# Patient Record
Sex: Male | Born: 1985 | Race: Black or African American | Hispanic: No | Marital: Single | Smoking: Former smoker
Health system: Southern US, Community
[De-identification: ages and names within clinical notes are randomized; demographics above are authoritative.]

---

## 2011-06-19 ENCOUNTER — Emergency Department (HOSPITAL_COMMUNITY)
Admission: EM | Admit: 2011-06-19 | Discharge: 2011-06-20 | Disposition: A | Discharge status: Home / Self Care | Payer: Managed Care, Other (non HMO) | Attending: Emergency Medicine | Admitting: Emergency Medicine

## 2011-06-19 DIAGNOSIS — M79609 Pain in unspecified limb: Secondary | ICD-10-CM | POA: Insufficient documentation

## 2011-06-19 DIAGNOSIS — Z202 Contact with and (suspected) exposure to infections with a predominantly sexual mode of transmission: Secondary | ICD-10-CM | POA: Insufficient documentation

## 2011-06-19 DIAGNOSIS — R369 Urethral discharge, unspecified: Secondary | ICD-10-CM | POA: Insufficient documentation

## 2011-06-23 LAB — GC/CHLAMYDIA PROBE AMP, GENITAL: Chlamydia, DNA Probe: NEGATIVE

## 2011-06-25 ENCOUNTER — Inpatient Hospital Stay (INDEPENDENT_AMBULATORY_CARE_PROVIDER_SITE_OTHER)
Admission: RE | Admit: 2011-06-25 | Discharge: 2011-06-25 | Disposition: A | Discharge status: Home / Self Care | Payer: Managed Care, Other (non HMO) | Source: Ambulatory Visit | Attending: Emergency Medicine | Admitting: Emergency Medicine

## 2011-06-25 ENCOUNTER — Ambulatory Visit (INDEPENDENT_AMBULATORY_CARE_PROVIDER_SITE_OTHER): Payer: Managed Care, Other (non HMO)

## 2011-06-25 DIAGNOSIS — M659 Synovitis and tenosynovitis, unspecified: Secondary | ICD-10-CM

## 2012-09-27 ENCOUNTER — Encounter (HOSPITAL_COMMUNITY): Payer: Self-pay | Admitting: Emergency Medicine

## 2012-09-27 ENCOUNTER — Emergency Department (HOSPITAL_COMMUNITY)
Admission: EM | Admit: 2012-09-27 | Discharge: 2012-09-27 | Disposition: A | Discharge status: Home / Self Care | Payer: BC Managed Care – PPO | Attending: Emergency Medicine | Admitting: Emergency Medicine

## 2012-09-27 DIAGNOSIS — L03019 Cellulitis of unspecified finger: Secondary | ICD-10-CM | POA: Insufficient documentation

## 2012-09-27 DIAGNOSIS — Z87891 Personal history of nicotine dependence: Secondary | ICD-10-CM | POA: Insufficient documentation

## 2012-09-27 DIAGNOSIS — L03012 Cellulitis of left finger: Secondary | ICD-10-CM

## 2012-09-27 MED ORDER — CEPHALEXIN 500 MG PO CAPS: 500.0000 mg | ORAL_CAPSULE | Freq: Four times a day (QID) | ORAL | Status: DC

## 2012-09-27 MED ORDER — NAPROXEN 500 MG PO TABS: 500.0000 mg | ORAL_TABLET | Freq: Two times a day (BID) | ORAL | Status: DC

## 2012-09-27 NOTE — ED Notes (Signed)
Pt presents w/ infection to the bed of his left thumb nail, states hx of same. Noticed it getting sore last week.

## 2012-09-27 NOTE — ED Provider Notes (Signed)
Medical screening examination/treatment/procedure(s) were performed by non-physician practitioner and as supervising physician I was immediately available for consultation/collaboration.    Gaetan Spieker R Salvador Bigbee, MD 09/27/12 2234 

## 2012-09-27 NOTE — ED Provider Notes (Signed)
History     CSN: [id number]  Arrival date & time 09/27/12  1508   First MD Initiated Contact with Patient 09/27/12 1539      Chief Complaint  Patient presents with  . Nail Problem    (Consider location/radiation/quality/duration/timing/severity/associated sxs/prior treatment) HPI Louis Freeman is a 26 y.o. male who presents to ed with complaint of left thumb infection. 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 States now swelling, pain, tenderness around the nail. States soaked it in epsom salt, it is worsening. No fever, chills, no pain going down the finger. No pain with movement of the finger.    History reviewed. No pertinent past medical history.  History reviewed. No pertinent past surgical history.  No family history on file.  History  Substance Use Topics  . Smoking status: Former Smoker    Quit date: 05/27/2012  . Smokeless tobacco: Never Used  . Alcohol Use: No      Review of Systems  Constitutional: Negative for fever and chills.  Musculoskeletal:       Finger pain  Skin: Positive for wound.  Neurological: Negative for weakness and numbness.    Allergies  Review of patient's allergies indicates no known allergies.  Home Medications  No current outpatient prescriptions on file.  BP 117/66  Pulse 63  Temp 98.4 F (36.9 C) (Oral)  Resp 16  SpO2 99%  Physical Exam  Nursing note and vitals reviewed. Constitutional: He is oriented to person, place, and time. He appears well-developed and well-nourished. No distress.  Eyes: Conjunctivae normal are normal.  Cardiovascular: Normal rate, regular rhythm and normal heart sounds.   Pulmonary/Chest: Effort normal and breath sounds normal. No respiratory distress. He has no wheezes. He has no rales.  Musculoskeletal:       paronychia around left thumb nail. Tender to palpation. Normal joints of the thumb, full ROM of the finger.   Neurological: He is alert and oriented  to person, place, and time.    ED Course  Procedures (including critical care time)  INCISION AND DRAINAGE Performed by: Jaynie Crumble A Consent: Verbal consent obtained. Risks and benefits: risks, benefits and alternatives were discussed Type: paronychia  Body area: left thumb Anesthesia:none   Incision was made with a scalpel.  Drainage: purulent, bloody  Drainage amount: small   Patient tolerance: Patient tolerated the procedure well with no immediate complications.     1. Paronychia of thumb, left       MDM  Left thumb paronychia, drained in ED. Pt afebrile, non toxic. Will start on keflex for infection, soapy warm soaks at home, follow up if worsening.         Lottie Mussel, [place] 09/27/12 2104

## 2012-10-02 ENCOUNTER — Encounter (HOSPITAL_COMMUNITY): Payer: Self-pay | Admitting: *Deleted

## 2012-10-02 ENCOUNTER — Emergency Department (HOSPITAL_COMMUNITY)
Admission: EM | Admit: 2012-10-02 | Discharge: 2012-10-02 | Disposition: A | Discharge status: Home / Self Care | Payer: BC Managed Care – PPO | Attending: Emergency Medicine | Admitting: Emergency Medicine

## 2012-10-02 DIAGNOSIS — IMO0002 Reserved for concepts with insufficient information to code with codable children: Secondary | ICD-10-CM

## 2012-10-02 DIAGNOSIS — L03019 Cellulitis of unspecified finger: Secondary | ICD-10-CM | POA: Insufficient documentation

## 2012-10-02 DIAGNOSIS — Z87891 Personal history of nicotine dependence: Secondary | ICD-10-CM | POA: Insufficient documentation

## 2012-10-02 MED ORDER — HYDROCODONE-ACETAMINOPHEN 5-325 MG PO TABS: 1.0000 | ORAL_TABLET | ORAL | Status: DC | PRN

## 2012-10-02 MED ORDER — IBUPROFEN 800 MG PO TABS: 800.0000 mg | ORAL_TABLET | Freq: Three times a day (TID) | ORAL | Status: DC | PRN

## 2012-10-02 MED ORDER — SULFAMETHOXAZOLE-TRIMETHOPRIM 800-160 MG PO TABS: 2.0000 | ORAL_TABLET | Freq: Two times a day (BID) | ORAL | Status: AC

## 2012-10-02 NOTE — ED Provider Notes (Signed)
Medical screening examination/treatment/procedure(s) were conducted as a shared visit with non-physician practitioner(s) and myself.  I personally evaluated the patient during the encounter  Patient a large paronychia of his thumb.  This was drained at the bedside.  I was present for the procedure.  He does not have frank evidence of a felon at this time.  I think switching the patient to Bactrim and continuing warm soap soaks as a beneficial thing.  Recommend returning emergency permanent 48 hours for recheck of his thumb.  No systemic symptoms.  Well-appearing.  Lyanne Co, MD 10/02/12 1229

## 2012-10-02 NOTE — ED Notes (Signed)
Pt seen here last week with left thumb infection, continues to swell and painful

## 2012-10-02 NOTE — ED Provider Notes (Signed)
History     CSN: [id number]  Arrival date & time 10/02/12  4098   First MD Initiated Contact with Patient 10/02/12 0735      Chief Complaint  Patient presents with  . Hand Problem    left thumb infection    (Consider location/radiation/quality/duration/timing/severity/associated sxs/prior treatment) HPI Comments: Pt seen in ED 5 days ago for left thumb infection, diagnosed with paronychia and placed on keflex, presents today for worsening symptoms.  Reports increased swelling and pain in his thumb, mild decreased sensation in the swollen area.  Pain is throbbing, 8/10 intensity.  Pt is unable to bend interphalangeal joint due to swelling but has full ROM of MCP without pain or difficulty.  Has been soaking it in warm soapy water, water with epsom salt, and peroxide without improvement.  Denies fevers, pain or redness going beyond his thumb.  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  Pt is left handed.   The history is provided by the patient and medical records.    History reviewed. No pertinent past medical history.  History reviewed. No pertinent past surgical history.  No family history on file.  History  Substance Use Topics  . Smoking status: Former Smoker    Quit date: 05/27/2012  . Smokeless tobacco: Never Used  . Alcohol Use: No      Review of Systems  Constitutional: Negative for fever and chills.  Skin: Positive for wound. Negative for pallor.  Neurological: Positive for numbness. Negative for weakness.    Allergies  Review of patient's allergies indicates no known allergies.  Home Medications   Current Outpatient Rx  Name  Route  Sig  Dispense  Refill  . CEPHALEXIN 500 MG PO CAPS   Oral   Take 1 capsule (500 mg total) by mouth 4 (four) times daily.   28 capsule   0   . NAPROXEN 500 MG PO TABS   Oral   Take 1 tablet (500 mg total) by mouth 2 (two) times daily.   30 tablet   0     BP 119/74   Pulse 55  Temp 97.8 F (36.6 C) (Oral)  Resp 18  SpO2 99%  Physical Exam  Nursing note and vitals reviewed. Constitutional: He appears well-developed and well-nourished. No distress.  HENT:  Head: Normocephalic and atraumatic.  Neck: Neck supple.  Pulmonary/Chest: Effort normal.  Musculoskeletal:       Left hand: He exhibits decreased range of motion, tenderness and swelling. He exhibits normal capillary refill and no deformity. decreased sensation noted. Normal strength noted.       Hands: Neurological: He is alert.  Skin: He is not diaphoretic.    ED Course  Procedures (including critical care time)  Labs Reviewed - No data to display No results found.  Discussed patient with Dr Patria Mane who has also seen the patient.   INCISION AND DRAINAGE Performed by: Trixie Dredge B Consent: Verbal consent obtained. Risks and benefits: risks, benefits and alternatives were discussed Type: paronychia  Body area:left thumb  Anesthesia: digital block  Incision was made with a scalpel.  Local anesthetic: lidocaine 2% no epinephrine  Anesthetic total: 4.5 ml  Drainage: purulent  Drainage amount: large  Packing material: none  Patient tolerance: Patient tolerated the procedure well with no immediate complications.  Incision and drainage performed with Dr Patria Mane.     1. Paronychia     MDM  Patient with paronychia, seen  in ED 5 days ago with I&D with minimal drainage placed on keflex outpatient, returns today for worsening symptoms.  I&D performed today with moderate to large amount of purulent drainage.  Pt placed on bactrim DS with 48 hr follow up in ED.  Discussed diagnosis, treatment plan, and follow up with patient.  Discussed return precautions in depth.  Pt verbalizes understanding and agrees with plan.           [place], [place] 10/02/12 1025

## 2013-04-25 IMAGING — CR DG FOOT COMPLETE 3+V*R*
3 series · 3 of 3 positions shown · non-contrast
Comparison: None.

CLINICAL DATA: Pain overlying the fourth/fifth proximal
metatarsals.

RIGHT FOOT COMPLETE - 3+ VIEW

[view not recorded (1 of 3)]
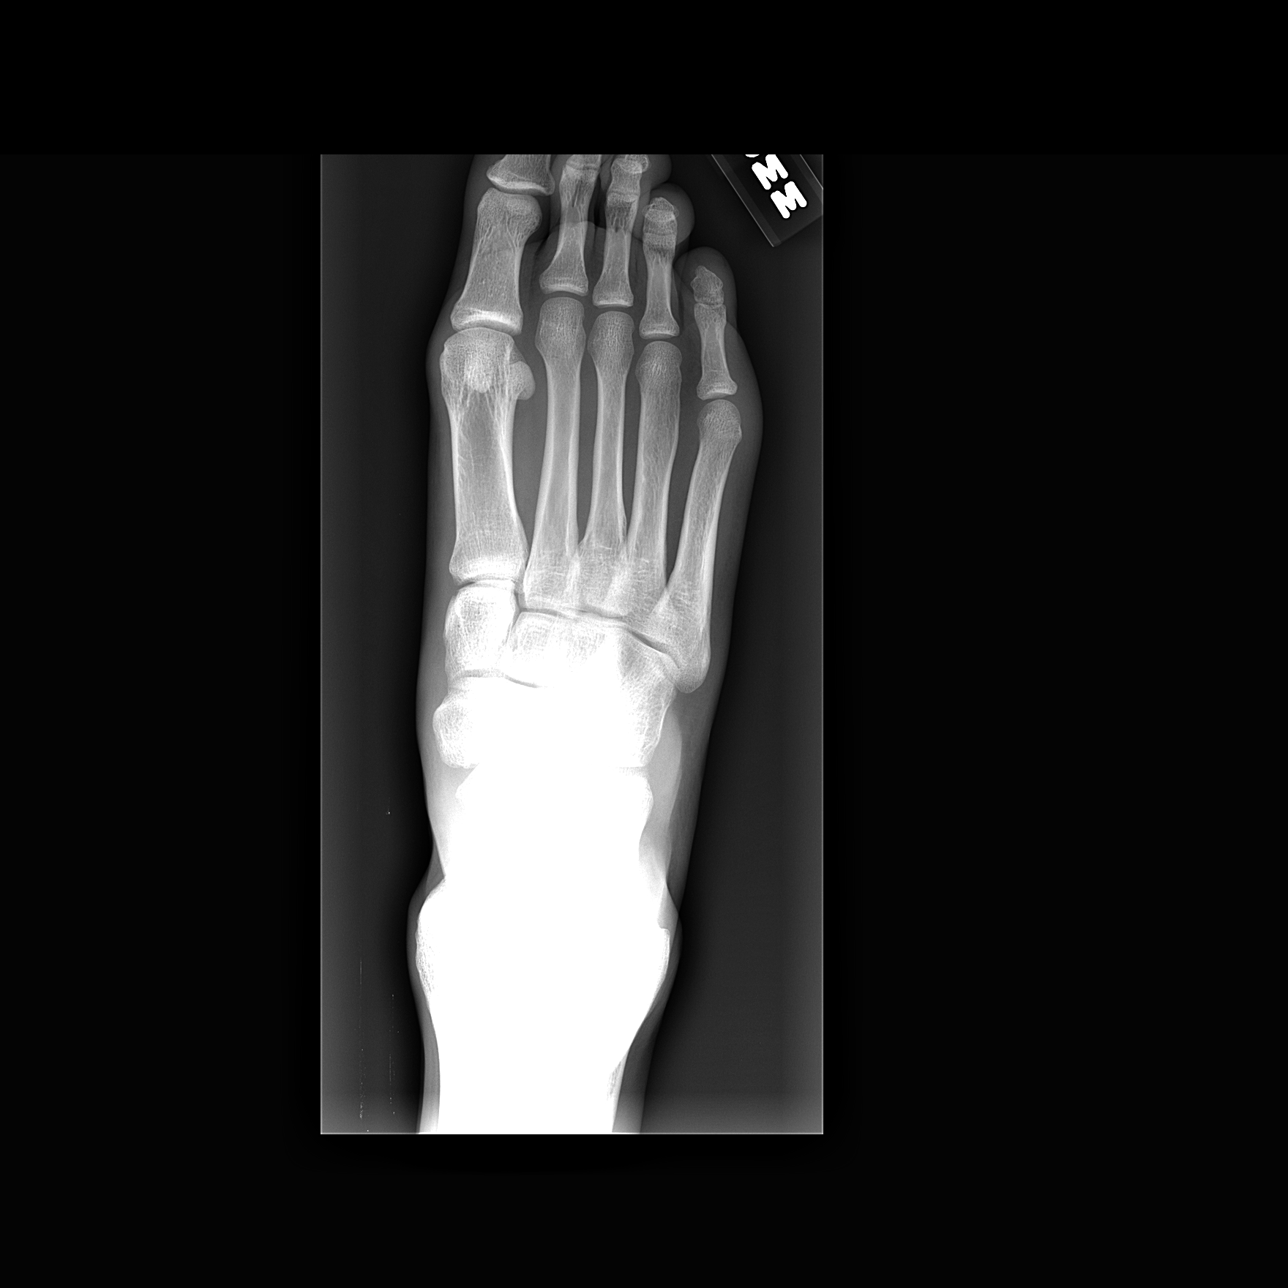

[view not recorded (2 of 3)]
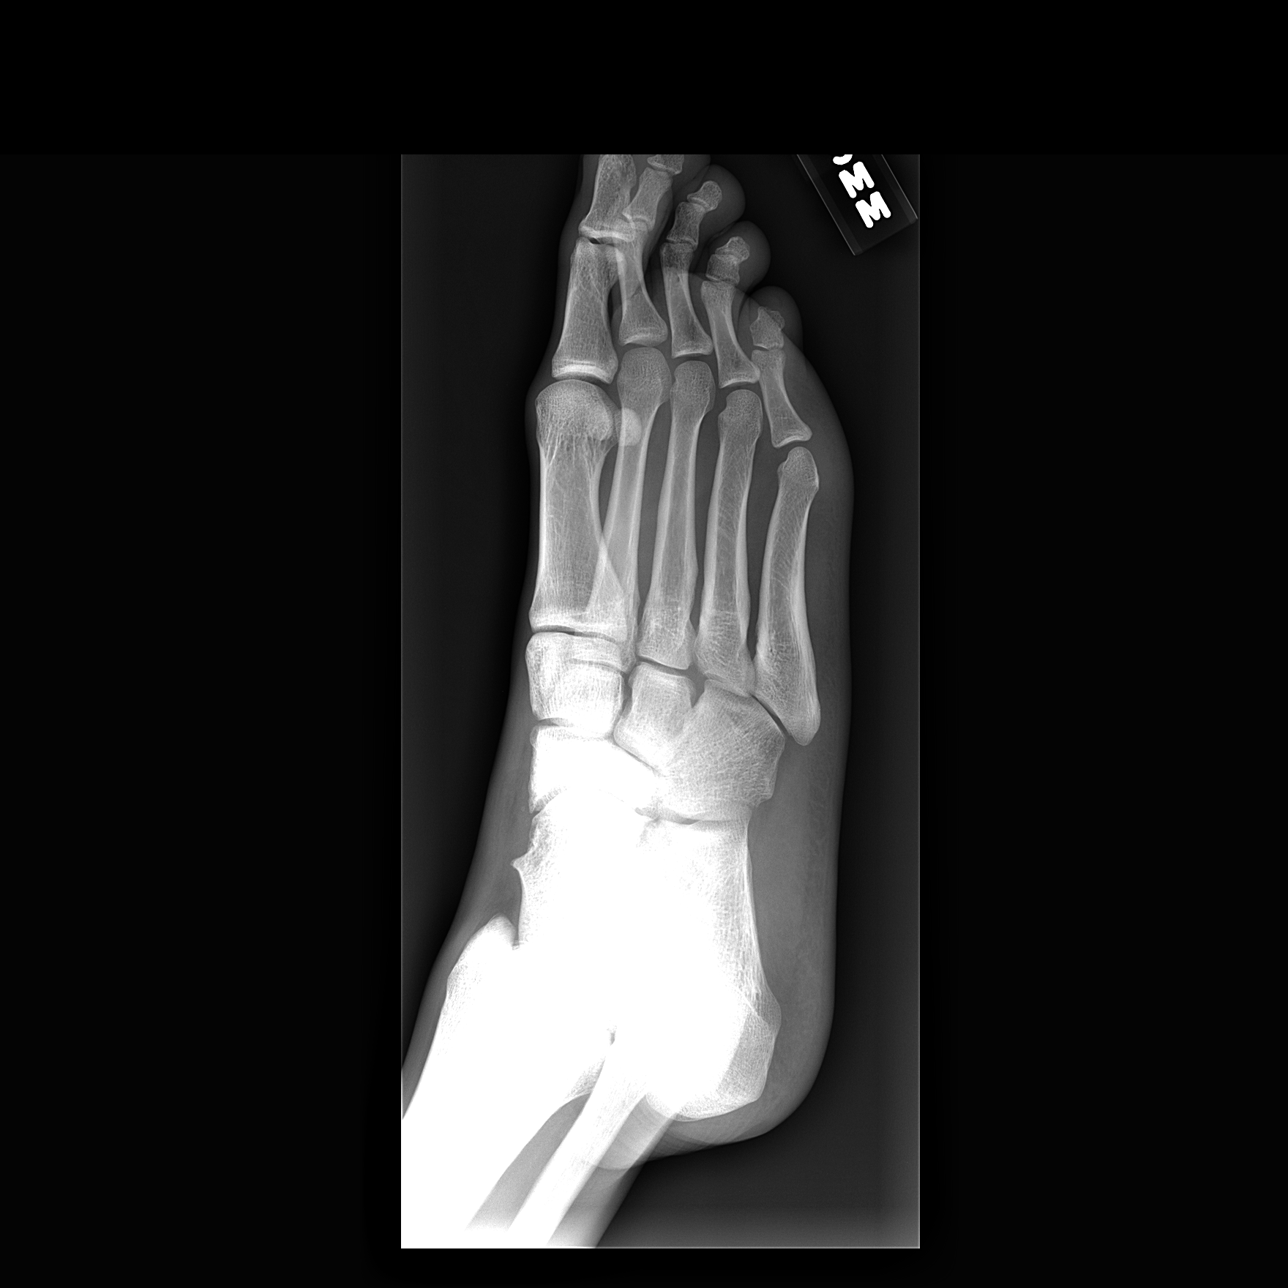

[view not recorded (3 of 3)]
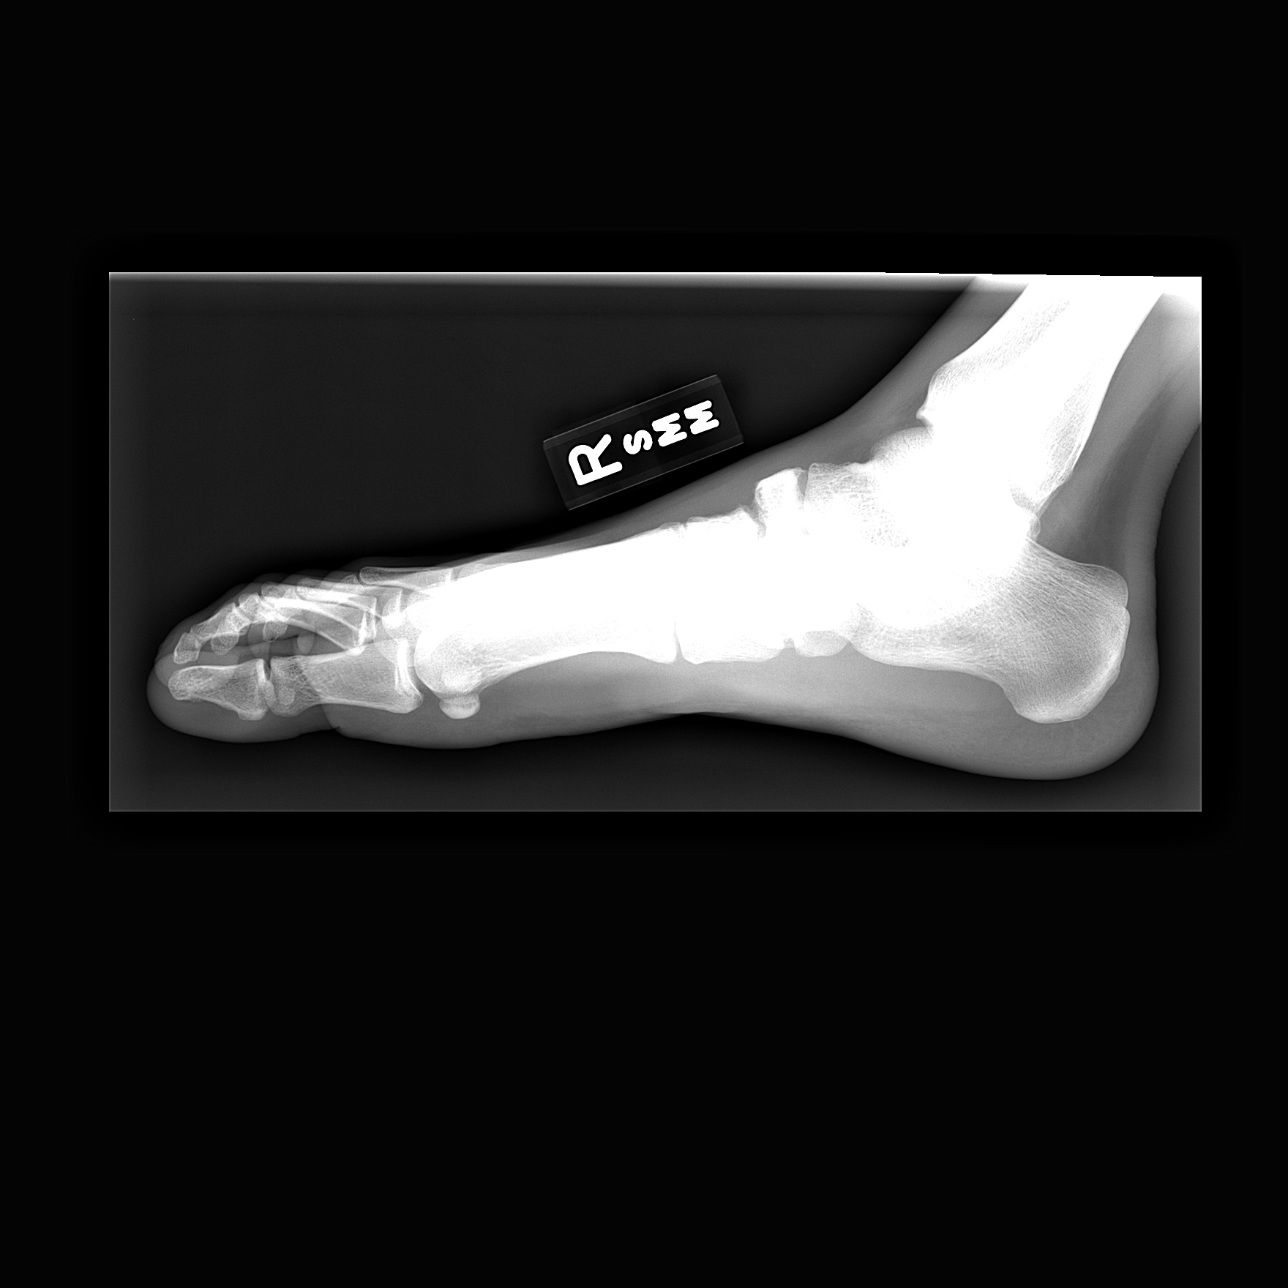

[3 of 3 positions shown; findings below may reference images not displayed]

FINDINGS: No fracture or dislocation is seen.

The joint spaces are preserved.

The visualized soft tissues are unremarkable.
IMPRESSION: No fracture or dislocation is seen.

## 2014-07-16 ENCOUNTER — Other Ambulatory Visit (HOSPITAL_COMMUNITY)
Admission: RE | Admit: 2014-07-16 | Discharge: 2014-07-16 | Disposition: A | Discharge status: Home / Self Care | Payer: Self-pay | Source: Ambulatory Visit | Attending: Emergency Medicine | Admitting: Emergency Medicine

## 2014-07-16 ENCOUNTER — Encounter (HOSPITAL_COMMUNITY): Payer: Self-pay | Admitting: Emergency Medicine

## 2014-07-16 ENCOUNTER — Emergency Department (INDEPENDENT_AMBULATORY_CARE_PROVIDER_SITE_OTHER)
Admission: EM | Admit: 2014-07-16 | Discharge: 2014-07-16 | Disposition: A | Discharge status: Home / Self Care | Payer: Self-pay | Source: Home / Self Care | Attending: Emergency Medicine | Admitting: Emergency Medicine

## 2014-07-16 DIAGNOSIS — Z113 Encounter for screening for infections with a predominantly sexual mode of transmission: Secondary | ICD-10-CM | POA: Insufficient documentation

## 2014-07-16 DIAGNOSIS — N342 Other urethritis: Secondary | ICD-10-CM

## 2014-07-16 MED ORDER — AZITHROMYCIN 250 MG PO TABS
ORAL_TABLET | ORAL | Status: AC
  Filled 2014-07-16: qty 4

## 2014-07-16 MED ORDER — CEFTRIAXONE SODIUM 250 MG IJ SOLR
250.0000 mg | Freq: Once | INTRAMUSCULAR | Status: AC
  Administered 2014-07-16: 250 mg via INTRAMUSCULAR

## 2014-07-16 MED ORDER — CEFTRIAXONE SODIUM 250 MG IJ SOLR
INTRAMUSCULAR | Status: AC
  Filled 2014-07-16: qty 250

## 2014-07-16 MED ORDER — AZITHROMYCIN 250 MG PO TABS
1000.0000 mg | ORAL_TABLET | Freq: Every day | ORAL | Status: DC
  Administered 2014-07-16: 1000 mg via ORAL

## 2014-07-16 MED ORDER — LIDOCAINE HCL (PF) 1 % IJ SOLN
INTRAMUSCULAR | Status: AC
  Filled 2014-07-16: qty 5

## 2014-07-16 NOTE — Discharge Instructions (Signed)
Urethritis °Urethritis is an inflammation of the tube through which urine exits your bladder (urethra).  °CAUSES °Urethritis is often caused by an infection in your urethra. The infection can be viral, like herpes. The infection can also be bacterial, like gonorrhea. °RISK FACTORS °Risk factors of urethritis include: °· Having sex without using a condom. °· Having multiple sexual partners. °· Having poor hygiene. °SIGNS AND SYMPTOMS °Symptoms of urethritis are less noticeable in women than in men. These symptoms include: °· Burning feeling when you urinate (dysuria). °· Discharge from your urethra. °· Blood in your urine (hematuria). °· Urinating more than usual. °DIAGNOSIS  °To confirm a diagnosis of urethritis, your health care provider will do the following: °· Ask about your sexual history. °· Perform a physical exam. °· Have you provide a sample of your urine for lab testing. °· Use a cotton swab to gently collect a sample from your urethra for lab testing. °TREATMENT  °It is important to treat urethritis. Depending on the cause, untreated urethritis may lead to serious genital infections and possibly infertility. Urethritis caused by a bacterial infection is treated with antibiotic medicine. All sexual partners must be treated.  °HOME CARE INSTRUCTIONS °· Do not have sex until the test results are known and treatment is completed, even if your symptoms go away before you finish treatment. °· If you were prescribed an antibiotic, finish it all even if you start to feel better. °SEEK MEDICAL CARE IF:  °· Your symptoms are not improved in 3 days. °· Your symptoms are getting worse. °· You develop abdominal pain or pelvic pain (in women). °· You develop joint pain. °· You have a fever. °SEEK IMMEDIATE MEDICAL CARE IF:  °· You have severe pain in the belly, back, or side. °· You have repeated vomiting. °MAKE SURE YOU: °· Understand these instructions. °· Will watch your condition. °· Will get help right away if you  are not doing well or get worse. °Document Released: 04/16/2001 Document Revised: 03/07/2014 Document Reviewed: 06/21/2013 °Exit[employer]® Patient Information ©2015 [employer]. This information is not intended to replace advice given to you by your health care provider. Make sure you discuss any questions you have with your health care provider. ° °

## 2014-07-16 NOTE — ED Provider Notes (Signed)
CSN: [id number]     Arrival date & time 07/16/14  1833 History   First MD Initiated Contact with Patient 07/16/14 1850     Chief Complaint  Patient presents with  . Exposure to STD   (Consider location/radiation/quality/duration/timing/severity/associated sxs/prior Treatment) HPI Comments: 28 year old male presents for evaluation of penile discharge. This started yesterday. He has penile discharge without any other symptoms. He had gonorrhea 3 years ago and he is worried that he was not adequately treated although he did get fully treated while he was in the emergency department. He denies any new sexual partners. No testicle pain or abdominal pain. No burning with urination.  Patient is a 28 y.o. male presenting with STD exposure.  Exposure to STD    History reviewed. No pertinent past medical history. History reviewed. No pertinent past surgical history. History reviewed. No pertinent family history. History  Substance Use Topics  . Smoking status: Former Smoker    Quit date: 05/27/2012  . Smokeless tobacco: Never Used  . Alcohol Use: No    Review of Systems  Genitourinary: Positive for discharge.  All other systems reviewed and are negative.   Allergies  Review of patient's allergies indicates no known allergies.  Home Medications   Prior to Admission medications   Medication Sig Start Date End Date Taking? Authorizing Provider  acetaminophen (TYLENOL) 500 MG tablet Take 500 mg by mouth every 6 (six) hours as needed. pain    Historical Provider, MD  cephALEXin (KEFLEX) 500 MG capsule Take 1 capsule (500 mg total) by mouth 4 (four) times daily. 09/27/12   Tatyana A Kirichenko, PA-C  HYDROcodone-acetaminophen (NORCO/VICODIN) 5-325 MG per tablet Take 1 tablet by mouth every 4 (four) hours as needed for pain. 10/02/12   Trixie Dredge, PA-C  ibuprofen (ADVIL,MOTRIN) 800 MG tablet Take 1 tablet (800 mg total) by mouth every 8 (eight) hours as needed for pain. 10/02/12   Trixie Dredge,  PA-C  naproxen (NAPROSYN) 500 MG tablet Take 1 tablet (500 mg total) by mouth 2 (two) times daily. 09/27/12   Tatyana A Kirichenko, PA-C   BP 124/78  Pulse 60  Temp(Src) 98 F (36.7 C) (Oral)  Resp 16  SpO2 100% Physical Exam  Nursing note and vitals reviewed. Constitutional: He is oriented to person, place, and time. He appears well-developed and well-nourished. No distress.  HENT:  Head: Normocephalic.  Pulmonary/Chest: Effort normal. No respiratory distress.  Genitourinary: Discharge (mucoid) found.  Lymphadenopathy:       Right: Inguinal adenopathy present.       Left: Inguinal adenopathy present.  Neurological: He is alert and oriented to person, place, and time. Coordination normal.  Skin: Skin is warm and dry. No rash noted. He is not diaphoretic.  Psychiatric: He has a normal mood and affect. Judgment normal.    ED Course  Procedures (including critical care time) Labs Review Labs Reviewed  URINE CULTURE  URINE CYTOLOGY ANCILLARY ONLY    Imaging Review No results found.   MDM   1. Urethritis    Treated for GC and Chlamydia. Urine cytology sent. Followup PRN.  If positive, had test for cure in 3-6 weeks.   Meds ordered this encounter  Medications  . cefTRIAXone (ROCEPHIN) injection 250 mg    Sig:   . azithromycin (ZITHROMAX) tablet 1,000 mg    Sig:        Graylon Good, PA-C 07/16/14 1919

## 2014-07-16 NOTE — ED Notes (Addendum)
C/o penile discharge onset today.  He was treated for GC 1 1/2 yrs ago, but it came back.  I told pt. It does not come back unless he was re-exposed.  No dysuria. Chart reviewed and pt. was adequately treated for GC with Rocephin and Zithromax on 06/19/2011.

## 2014-07-18 LAB — URINE CULTURE
COLONY COUNT: NO GROWTH
CULTURE: NO GROWTH

## 2014-07-18 NOTE — ED Provider Notes (Signed)
Medical screening examination/treatment/procedure(s) were performed by resident physician or non-physician practitioner and as supervising physician I was immediately available for consultation/collaboration.   Barkley Bruns MD.   Linna Hoff, MD 07/18/14 [phone]

## 2015-03-16 ENCOUNTER — Ambulatory Visit (INDEPENDENT_AMBULATORY_CARE_PROVIDER_SITE_OTHER): Payer: 59 | Admitting: Family Medicine

## 2015-03-16 VITALS — BP 114/58 | HR 50 | Temp 97.9°F | Resp 16 | Ht 71.0 in | Wt 155.0 lb

## 2015-03-16 DIAGNOSIS — G44219 Episodic tension-type headache, not intractable: Secondary | ICD-10-CM

## 2015-03-16 MED ORDER — CYCLOBENZAPRINE HCL 10 MG PO TABS: ORAL_TABLET | ORAL | Status: DC

## 2015-03-16 NOTE — Progress Notes (Signed)
   Subjective:    Patient ID: Louis Freeman, male    DOB: [birth year], 29 y.o.   MRN: [id number]  HPI Patient presents today with headache for 5 days. Pain is "all over," and occasionally in his occipital area. It is worse with bending over. No recent or past head injury. Tried one dose of tylenol without relief. Had a similar headache in the past that did not last as long. Pain currently a 7/10. Was worse this morning when he woke up, but has eased off. He otherwise feels well. He slept well last night. [place] recently flew to [place] for his job with [employer]. He thinks the flight might have worsened his pain. He lifts heavy ladders at work, but denies any back or neck pain.  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 He has had several documented heart rates in the 50-60s. He denies dizziness, chest pain, SOB.    History reviewed. No pertinent past medical history. History reviewed. No pertinent past surgical history. History reviewed. No pertinent family history. History  Substance Use Topics  . Smoking status: Former Smoker    Quit date: 05/27/2012  . Smokeless tobacco: Never Used  . Alcohol Use: 1.2 oz/week    2 Standard drinks or equivalent per week   Medications, allergies, past medical history, surgical history, family history, social history and problem list reviewed and updated.  Review of Systems No fever, no nasal drainage/congestion/facial pain, no nausea/abdominal pain. No visual changes. No aura. No photo/phonosensitivity.     Objective:   Physical Exam  Constitutional: He is oriented to person, place, and time. He appears well-developed and well-nourished.  HENT:  Head: Normocephalic and atraumatic.  Eyes: Conjunctivae are normal. Pupils are equal, round, and reactive to light.  Neck: Normal range of motion. Neck supple.  Cardiovascular: Regular rhythm and normal heart sounds.  Bradycardia present.   HR by auscultation 54.  Pulmonary/Chest:  Effort normal and breath sounds normal.  Musculoskeletal: Normal range of motion. He exhibits no edema or tenderness.  Neurological: He is alert and oriented to person, place, and time. No cranial nerve deficit. Coordination normal.  Skin: Skin is warm and dry.  Psychiatric: He has a normal mood and affect. His behavior is normal. Judgment and thought content normal.  Vitals reviewed.  BP 114/58 mmHg  Pulse 50  Temp(Src) 97.9 F (36.6 C) (Oral)  Resp 16  Ht 5\' 11"  (1.803 m)  Wt 155 lb (70.308 kg)  BMI 21.63 kg/m2  SpO2 99%     Assessment & Plan:  1. Episodic tension-type headache, not intractable -Provided written and verbal information regarding diagnosis and treatment. - Discussed warning signs that require immediate follow up- severe pain, visual changes, vomiting. - Provided patient with some Alleve samples- can take 2 twice a day and suggested he add a serving of caffeine. - cyclobenzaprine (FLEXERIL) 10 MG tablet; Take 1/2 to 1 tablet at bedtime as needed  Dispense: 10 tablet; Refill: 0 - RTC if no improvement in 24 hours  Olean Reeeborah Patrisha Hausmann, FNP-BC  [facility]  03/16/2015 10:43 PM

## 2015-03-16 NOTE — Patient Instructions (Signed)

## 2015-09-24 ENCOUNTER — Ambulatory Visit (INDEPENDENT_AMBULATORY_CARE_PROVIDER_SITE_OTHER): Payer: 59 | Admitting: Physician Assistant

## 2015-09-24 VITALS — BP 108/70 | HR 85 | Temp 98.1°F | Resp 18 | Ht 71.5 in | Wt 151.4 lb

## 2015-09-24 DIAGNOSIS — R1084 Generalized abdominal pain: Secondary | ICD-10-CM | POA: Diagnosis not present

## 2015-09-24 MED ORDER — DICYCLOMINE HCL 10 MG PO CAPS: 10.0000 mg | ORAL_CAPSULE | Freq: Three times a day (TID) | ORAL | Status: DC

## 2015-09-24 NOTE — Patient Instructions (Signed)
Push fluids Eat as tolerated

## 2015-09-24 NOTE — Progress Notes (Signed)
   Louis Freeman  MRN: [id number] DOB: Feb 12, 1986  Subjective:  Pt presents to clinic with 4 day/ h/o sharp abd pain.  Location of the pain moves around.  When the pain started he had loose stool but now he is straining to past a stool since yesterday after he took a dose of imodium.  He is having no F/C.   Did have a small amount of blood on toilet tissue today but none on the stool that he noticed.  No one is sick at home.  He over all feels better the pain is less frequent but not less intense.  He does not have the pain at work it only happens at home and he is not having trouble sleeping at night.  Sick contacts.  OTC meds - Imodium yesterday  There are no active problems to display for this patient.   No current outpatient prescriptions on file prior to visit.   No current facility-administered medications on file prior to visit.    No Known Allergies  Review of Systems  Constitutional: Positive for chills. Negative for fever.  HENT: Negative.   Gastrointestinal: Positive for abdominal pain and blood in stool (with wiping). Negative for nausea, vomiting and diarrhea.  Genitourinary: Negative.    Objective:  BP 108/70 mmHg  Pulse 85  Temp(Src) 98.1 F (36.7 C) (Oral)  Resp 18  Ht 5' 11.5" (1.816 m)  Wt 151 lb 6.4 oz (68.675 kg)  BMI 20.82 kg/m2  SpO2 98%  Physical Exam  Constitutional: He is oriented to person, place, and time and well-developed, well-nourished, and in no distress.  HENT:  Head: Normocephalic and atraumatic.  Right Ear: External ear normal.  Left Ear: External ear normal.  Eyes: Conjunctivae are normal.  Neck: Normal range of motion.  Cardiovascular: Normal rate, regular rhythm and normal heart sounds.   Pulmonary/Chest: Effort normal and breath sounds normal. He has no wheezes.  Abdominal: Soft. Bowel sounds are normal. There is no tenderness.  Neurological: He is alert and oriented to person, place, and time. Gait normal.  Skin: Skin is warm  and dry.  Psychiatric: Mood, memory, affect and judgment normal.    Assessment and Plan :  Generalized abdominal pain - Plan: dicyclomine (BENTYL) 10 MG capsule  Symptomatic treatment d/w pt.  I am reassured because the pain is intermittent and less frequent and the position of the pain changes.  We will symptomatically treat his pain with bentyl and he will monitor for continued and worsening bleeding and RTC if that occurs.  We talked about constipation and hemorrhoids resulting that could cause the bleeding that he experienced today.  His questions were answered and he agrees and understands the plan.  Benny LennertSarah Jolana Runkles PA-C  [facility] 09/24/2015 4:35 PM

## 2015-09-27 ENCOUNTER — Emergency Department (HOSPITAL_COMMUNITY)
Admission: EM | Admit: 2015-09-27 | Discharge: 2015-09-27 | Disposition: A | Discharge status: Home / Self Care | Payer: 59 | Attending: Emergency Medicine | Admitting: Emergency Medicine

## 2015-09-27 ENCOUNTER — Encounter (HOSPITAL_COMMUNITY): Payer: Self-pay

## 2015-09-27 DIAGNOSIS — R1030 Lower abdominal pain, unspecified: Secondary | ICD-10-CM | POA: Diagnosis present

## 2015-09-27 DIAGNOSIS — Z79899 Other long term (current) drug therapy: Secondary | ICD-10-CM | POA: Diagnosis not present

## 2015-09-27 DIAGNOSIS — Z87891 Personal history of nicotine dependence: Secondary | ICD-10-CM | POA: Insufficient documentation

## 2015-09-27 LAB — URINALYSIS, ROUTINE W REFLEX MICROSCOPIC
Bilirubin Urine: NEGATIVE
GLUCOSE, UA: NEGATIVE mg/dL
HGB URINE DIPSTICK: NEGATIVE
Ketones, ur: NEGATIVE mg/dL
Leukocytes, UA: NEGATIVE
Nitrite: NEGATIVE
PH: 7 (ref 5.0–8.0)
PROTEIN: NEGATIVE mg/dL
Specific Gravity, Urine: 1.019 (ref 1.005–1.030)

## 2015-09-27 LAB — COMPREHENSIVE METABOLIC PANEL
ALBUMIN: 4.5 g/dL (ref 3.5–5.0)
ALK PHOS: 40 U/L (ref 38–126)
ALT: 12 U/L — AB (ref 17–63)
AST: 19 U/L (ref 15–41)
Anion gap: 6 (ref 5–15)
BUN: 11 mg/dL (ref 6–20)
CALCIUM: 10.1 mg/dL (ref 8.9–10.3)
CHLORIDE: 103 mmol/L (ref 101–111)
CO2: 29 mmol/L (ref 22–32)
CREATININE: 1.27 mg/dL — AB (ref 0.61–1.24)
GFR calc Af Amer: >60 mL/min (ref >60)
GFR calc non Af Amer: >60 mL/min (ref >60)
GLUCOSE: 95 mg/dL (ref 65–99)
Potassium: 4.7 mmol/L (ref 3.5–5.1)
SODIUM: 138 mmol/L (ref 135–145)
Total Bilirubin: 0.8 mg/dL (ref 0.3–1.2)
Total Protein: 7.2 g/dL (ref 6.5–8.1)

## 2015-09-27 LAB — CBC WITH DIFFERENTIAL/PLATELET
BASOS ABS: 0 10*3/uL (ref 0.0–0.1)
Basophils Relative: 0 %
EOS ABS: 0.1 10*3/uL (ref 0.0–0.7)
EOS PCT: 2 %
HCT: 45.2 % (ref 39.0–52.0)
HEMOGLOBIN: 15.7 g/dL (ref 13.0–17.0)
LYMPHS ABS: 1.7 10*3/uL (ref 0.7–4.0)
Lymphocytes Relative: 37 %
MCH: 30.8 pg (ref 26.0–34.0)
MCHC: 34.7 g/dL (ref 30.0–36.0)
MCV: 88.6 fL (ref 78.0–100.0)
Monocytes Absolute: 0.6 10*3/uL (ref 0.1–1.0)
Monocytes Relative: 12 %
NEUTROS PCT: 49 %
Neutro Abs: 2.2 10*3/uL (ref 1.7–7.7)
PLATELETS: 270 10*3/uL (ref 150–400)
RBC: 5.1 MIL/uL (ref 4.22–5.81)
RDW: 12.4 % (ref 11.5–15.5)
WBC: 4.5 10*3/uL (ref 4.0–10.5)

## 2015-09-27 LAB — POC OCCULT BLOOD, ED: FECAL OCCULT BLD: POSITIVE — AB

## 2015-09-27 LAB — LIPASE, BLOOD: LIPASE: 27 U/L (ref 11–51)

## 2015-09-27 MED ORDER — OMEPRAZOLE 20 MG PO CPDR: 20.0000 mg | DELAYED_RELEASE_CAPSULE | Freq: Two times a day (BID) | ORAL | Status: DC

## 2015-09-27 MED ORDER — SODIUM CHLORIDE 0.9 % IV BOLUS (SEPSIS)
1000.0000 mL | Freq: Once | INTRAVENOUS | Status: AC
Start: 2015-09-27 — End: 2015-09-27
  Administered 2015-09-27: 1000 mL via INTRAVENOUS

## 2015-09-27 NOTE — Discharge Instructions (Signed)
Abdominal Pain, Adult °Many things can cause abdominal pain. Usually, abdominal pain is not caused by a disease and will improve without treatment. It can often be observed and treated at home. Your health care provider will do a physical exam and possibly order blood tests and X-rays to help determine the seriousness of your pain. However, in many cases, more time must pass before a clear cause of the pain can be found. Before that point, your health care provider may not know if you need more testing or further treatment. °HOME CARE INSTRUCTIONS °Monitor your abdominal pain for any changes. The following actions may help to alleviate any discomfort you are experiencing: °· Only take over-the-counter or prescription medicines as directed by your health care provider. °· Do not take laxatives unless directed to do so by your health care provider. °· Try a clear liquid diet (broth, tea, or water) as directed by your health care provider. Slowly move to a bland diet as tolerated. °SEEK MEDICAL CARE IF: °· You have unexplained abdominal pain. °· You have abdominal pain associated with nausea or diarrhea. °· You have pain when you urinate or have a bowel movement. °· You experience abdominal pain that wakes you in the night. °· You have abdominal pain that is worsened or improved by eating food. °· You have abdominal pain that is worsened with eating fatty foods. °· You have a fever. °SEEK IMMEDIATE MEDICAL CARE IF: °· Your pain does not go away within 2 hours. °· You keep throwing up (vomiting). °· Your pain is felt only in portions of the abdomen, such as the right side or the left lower portion of the abdomen. °· You pass bloody or black tarry stools. °MAKE SURE YOU: °· Understand these instructions. °· Will watch your condition. °· Will get help right away if you are not doing well or get worse. °  °This information is not intended to replace advice given to you by your health care provider. Make sure you discuss  any questions you have with your health care provider. °  °Document Released: 07/31/2005 Document Revised: 07/12/2015 Document Reviewed: 06/30/2013 °Elsevier Interactive Patient Education ©2016 Elsevier Inc. ° ° °Emergency Department Resource Guide °1) Find a Doctor and Pay Out of Pocket °Although you won't have to find out who is covered by your insurance plan, it is a good idea to ask around and get recommendations. You will then need to call the office and see if the doctor you have chosen will accept you as a new patient and what types of options they offer for patients who are self-pay. Some doctors offer discounts or will set up payment plans for their patients who do not have insurance, but you will need to ask so you aren't surprised when you get to your appointment. ° °2) Contact Your Local Health Department °Not all health departments have doctors that can see patients for sick visits, but many do, so it is worth a call to see if yours does. If you don't know where your local health department is, you can check in your phone book. The CDC also has a tool to help you locate your state's health department, and many state websites also have listings of all of their local health departments. ° °3) Find a Walk-in Clinic °If your illness is not likely to be very severe or complicated, you may want to try a walk in clinic. These are popping up all over the country in pharmacies, drugstores, and shopping centers.   They're usually staffed by nurse practitioners or physician assistants that have been trained to treat common illnesses and complaints. They're usually fairly quick and inexpensive. However, if you have serious medical issues or chronic medical problems, these are probably not your best option. ° °No Primary Care Doctor: °- Call [facility] at  832-8000 - they can help you locate a primary care doctor that  accepts your insurance, provides certain services, etc. °- Physician Referral Service-  1-800-533-3463 ° °Chronic Pain Problems: °Organization         Address  Phone   Notes  °[facility]  (336) 297-2271 Patients need to be referred by their primary care doctor.  ° °Medication Assistance: °Organization         Address  Phone   Notes  °[place] [facility] [address] °[place], [place] [address] (336) 641-8030 --Must be a resident of [place] °-- Must have NO insurance coverage whatsoever (no Medicaid/ Medicare, etc.) °-- The pt. MUST have a primary care doctor that directs their care regularly and follows them in the community °  °[facility]  (866) 331-1348   °[employer]  (888) 892-1162   ° °Agencies that provide inexpensive medical care: °Organization         Address  Phone   Notes  °[facility]  (336) 832-8035   °[facility]    (336) 832-7272   °[facility] [address] °[place], [place] [address] (336) 832-4777   °[facility] [address], °[place] (336) 271-4999   °[facility]    (336) 373-0678   °[facility]    (336) 272-1050   °[facility] ° [address], [place] Phone:  (336) 832-4444, Fax:  (336) 832-4440 Hours of Operation:  9 am - 6 pm, M-F.  Also accepts Medicaid/Medicare and self-pay.  °[facility] ° [address], [place] Phone: (336) 832-3150, Fax: (336) 832-3151. Hours of Operation:  8:30 am - 5:30 pm, M-F.  Also accepts Medicaid and self-pay.  °[facility] [place] [address], [place] Phone: (336) 878-6027   °[facility] [address], [place], [place] (336)723-1848, Ext. 123 Mondays & Thursdays: 7-9 AM.  First 15 patients are seen on a first come, first serve basis. °  ° °Medicaid-accepting [place] Providers: ° °Organization         Address  Phone   Notes  °[facility] [address],  [place] (336) 641-2100 Also accepts self-pay patients.  °[facility] [address], [place] ° (336) 856-9996   °[facility] [address], [place] (336) 288-8857   °[facility] [address] [place] [address], [place] (336) 299-7000   °Veita Bland [address], [place]  ° (336) 373-1557 Only accepts [place] Access Medicaid patients after they have their name applied to their card.  ° °Self-Pay (no insurance) in [place]: ° °Organization         Address  Phone   Notes  °Sickle Cell Patients, [facility] [address], [place] (336) 832-1970   °[facility] [address],  (336) 832-4400   ° [facility] ° [address], [place] (336) 992-4800   °  [facility]/Dr. Osei-Bonsu ° [address] [place] [address], [place] or [address], [place] (336) 841-8500 Phone number for both [place] and [place] locations is the same.  °[facility] [address], [place] (336) 299-0000   °[facility] [address] [place] [address], [place] or [address] (336) 852-7530 °(336) 878-2260   °[facility] [address], [place] (336) 350-1642, phone; (336) 294-5005, fax Sees patients 1st and 3rd Saturday of every month.  Must not qualify for public or private insurance (i.e. Medicaid, Medicare, [place] Health Choice, Veterans' Benefits) • Household income should be no more than 200% of the poverty level •The clinic cannot treat you if you are pregnant or think you are pregnant • Sexually transmitted diseases are not treated at the clinic.  ° ° °Dental Care: °Organization         Address  Phone  Notes  °[facility] [address], [place] (336) 641-6152 Accepts children up to age 21 who are enrolled in  Medicaid or [place] Health Choice; pregnant women with a Medicaid card; and children who have applied for Medicaid or [place] Health Choice, but were declined, whose parents can pay a reduced fee at time of service.  °[facility] [place]  [address], [place] (336) 641-7733 Accepts children up to age 21 who are enrolled in Medicaid or [place] Health Choice; pregnant women with a Medicaid card; and children who have applied for Medicaid or [place] Health Choice, but were declined, whose parents can pay a reduced fee at time of service.  °[facility] ° [address], [place] (336) 641-4533 Patients are seen by appointment only. Walk-ins are not accepted. [facility] will see patients 18 years of age and older. °Monday - Tuesday (8am-5pm) °Most Wednesdays (8:30-5pm) °$30 per visit, cash only  °[facility] ° [address], [place] (336) 641-4533 Patients are seen by appointment only. Walk-ins are not accepted. [facility] will see patients 18 years of age and older. °One Wednesday Evening (Monthly: Volunteer Based).  $30 per visit, cash only  °[facility]  (919) 537-3737 for adults; Children under age 4, call Graduate Pediatric Dentistry at (919) [phone]. Children aged 4-14, please call (919) [phone] to request a pediatric application. ° Dental services are provided in all areas of dental care including fillings, crowns and bridges, complete and partial dentures, implants, gum treatment, root canals, and extractions. Preventive care is also provided. Treatment is provided to both adults and children. °Patients are selected via a lottery and there is often a waiting list. °  °[facility] [address], °[place] ° (336) 763-8833 [email or web address] °  °[facility] [address], [place], [place] (336)723-1848, Ext. 123 Second and Fourth Thursday of each month, opens at 6:30  AM; Clinic ends at 9 AM.  Patients are seen on a first-come first-served basis, and a limited number are seen during each clinic.  ° °[facility] ° [address], [place],  (336) 723-7904   Eligibility Requirements °You must have lived in [place], [place], or [place] counties for at least the last three months. °  You cannot be eligible for state or federal sponsored healthcare insurance, including Veterans Administration, Medicaid, or Medicare. °  You generally cannot be eligible for healthcare insurance   through your employer.  °  How to apply: °Eligibility screenings are held every Tuesday and Wednesday afternoon from 1:00 pm until 4:00 pm. You do not need an appointment for the interview!  °[facility] [address], [place], [place] 336-631-2330   °[facility]  336-342-8273   °[facility]  336-703-3100   °[facility]  336-570-6415   ° °Behavioral Health Resources in the Community: °Intensive Outpatient Programs °Organization         Address  Phone  Notes  °[facility] [address], [place], [place] 336-878-6098   °[facility] [address], [place], [place] 336-832-9800   °[facility] [address], [place], [place] ° 336-882-2125   °[facility] [address],  °[place], [place] 1-800-853-5163 or 336-641-4981   °Substance Abuse Resources °Organization         Address  Phone  Notes  °[facility]  336-882-2125   °[facility]  336-784-9470   °[facility]  336-285-9073   °[facility]  336-845-3988   °Residential & Outpatient Substance Abuse Program  1-800-659-3381   °Psychological Services °Organization         Address  Phone  Notes  °[facility]  336- 832-9600   °[facility]  336- 378-7881   °[facility] [address], [place] 1-800-853-5163 or  336-641-4981   ° °Mobile Crisis Teams °Organization         Address  Phone  Notes  °[facility], Mobile Crisis Care Unit  1-877-626-1772   °Assertive °[facility] ° [address] [place], [place] 336-834-9664   °Sharon DeEsch [address] °[place] 336-554-5454   ° °Self-Help/Support Groups °Organization         Address  Phone             Notes  °[facility] - variety of support groups  336- 373-1402 Call for more information  °Narcotics Anonymous (NA), [facility] [address], °[place]  2 meetings at this location  ° °Residential Treatment Programs °Organization         Address  Phone  Notes  °[facility] [address],    °[place]  1-866-801-8205   °[facility] ° [address], [place], [place] 704-293-8524   °[facility] [address], [place] 336-845-3988 Admissions: 8am-3pm M-F  °[facility] [address]    °[place], [place] 336-841-1104   °[facility] [address], [place], [place] 336-379-7146   °[facility] [address]  °[place], [place] 336-285-9073   °[facility] - Intensive Outpatient [address], [place], [place] 336-852-3033   °[facility] [address]  °[place], [place] 1-877-615-2722 or 336-784-9470   °[facility] [address] [place], [place] 336-227-7417 Accepts Medicaid  °[facility] [address]  °[place] 1-800-659-3381 Substance Abuse/Addiction Treatment  ° °[place] Behavioral Health Resources °Organization         Address  Phone  Notes  °[facility]  (888) 581-9988   °Julie Brannon, PhD [address] [place], [place]   (336) 349-5553 or (336) 951-0000   °[facility]   [address] °[place],  (336) 349-4454   °  [facility] [address],  [place], [place] (336) 342-8316 Insurance/Medicaid/sponsorship through [facility]  °[facility] [address]                                    [place], [place] (336) 342-8316 Therapy/tele-psych/case  °[facility] [address]  ° [place], [place] (336) 349-2233    °Dr. Arfeen  (336) 349-4544   °[facility] [place]  [employer] [place] [facility] 1) [address], [place] °2) [address], [place] °3)  [address], [place] (336) 349-3220 °(336) 342-7768 ° °(336) 342-8140   °[place] [facility] (336) 342-1394 or (336) 342-3537 (After Hours)    ° ° ° °

## 2015-09-27 NOTE — ED Notes (Signed)
Pt complains of severe abd pain since last Thursday, was seen at Urgent Care and was given pain meds, pain is worse, pt states he's had diarrhea and some blood in his stool

## 2015-09-27 NOTE — ED Provider Notes (Signed)
CSN: [id number]     Arrival date & time 09/27/15  0502 History   First MD Initiated Contact with Patient 09/27/15 [phone]     Chief Complaint  Patient presents with  . Abdominal Pain     (Consider location/radiation/quality/duration/timing/severity/associated sxs/prior Treatment) HPI   29 year old male who presents for evaluation of abdominal pain. Patient report for the past week he has had recurrent low abdominal pain. States pain is across his lower abdomen, described as a stinging sensation, worsening in the morning and at nighttime and improves during the day. Pain is mild to moderate in intensity but has gotten worsen. Pain occasionally radiates to his back and down his legs improves when he walks. States that laying down may make the pain worse. Pain usually lasted for about 15-30 minutes. He denies any associated fever, chills, headache, lightheadedness, dizziness, chest pain, shortness of breath, productive cough, nausea vomiting diarrhea, dysuria, hematuria, or rash. He has notice occasional bright red blood on toilet paper when wipe.  Denies rectal pain or itchiness.  Patient was seen at the urgent care several days ago for this complaint and was prescribed Bentyl. Patient mentioned the medication did not provide any relief. He denies having any penile discharge, scrotal swelling, or any signs of hernia. He reported having diarrhea in the past which improves after he took  Imodium.  He is no longer taking Imodium.  He denies alcohol abuse and denies any recent trauma or injury.  History reviewed. No pertinent past medical history. History reviewed. No pertinent past surgical history. History reviewed. No pertinent family history. Social History  Substance Use Topics  . Smoking status: Former Smoker    Quit date: 05/27/2012  . Smokeless tobacco: Never Used  . Alcohol Use: 1.2 oz/week    2 Standard drinks or equivalent per week    Review of Systems  All other systems reviewed and  are negative.     Allergies  Review of patient's allergies indicates no known allergies.  Home Medications   Prior to Admission medications   Medication Sig Start Date End Date Taking? Authorizing Provider  dicyclomine (BENTYL) 10 MG capsule Take 1 capsule (10 mg total) by mouth 4 (four) times daily -  before meals and at bedtime. 09/24/15  Yes Morrell Riddle, PA-C  loperamide (IMODIUM A-D) 2 MG tablet Take 2 mg by mouth 4 (four) times daily as needed for diarrhea or loose stools.   Yes Historical Provider, MD   BP 107/69 mmHg  Pulse 66  Temp(Src) 98.1 F (36.7 C)  Resp 15  Ht  (1.803 m)  Wt 68.493 kg  BMI 21.07 kg/m2  SpO2 100% Physical Exam  Constitutional: He appears well-developed and well-nourished. No distress.  HENT:  Head: Atraumatic.  Eyes: Conjunctivae are normal.  Neck: Neck supple.  Cardiovascular: Normal rate and regular rhythm.   Pulmonary/Chest: Effort normal and breath sounds normal.  Abdominal: Soft. Bowel sounds are normal. There is no tenderness.  Genitourinary:  Chaperone present during exam. On digital rectal examination, patient has normal rectal tone, no mass, no anal fissure, no THROMBOSED hemorrhoid, Hemoccult positive.  Neurological: He is alert.  Skin: No rash noted.  Psychiatric: He has a normal mood and affect.  Nursing note and vitals reviewed.   ED Course  Procedures (including critical care time)  Patient here with low abdominal pain. He does not have any reproducible abdominal pain on exam. Is afebrile with stable normal vital sign. He rate his pain as 6 out of  10 however declined pain medication when I offered.  9:10 AM Labs are reassuring. Mild elevate creatinine of 1.27, no prior value for comparison. Patient was made aware of this. Urine shows no signs of urinary tract infection. Fecal occult was positive but no frank bleeding. Likely from hemorrhoid.   At this time my suspicion for acute emergent medical condition such as  appendicitis, colitis, diverticulitis, UC or inflammatory bowel disease are low.  Will give GI referral along with outpt resources.  Prilosec given as needed.  Return precaution discussed.    Labs Review Labs Reviewed  COMPREHENSIVE METABOLIC PANEL - Abnormal; Notable for the following:    Creatinine, Ser 1.27 (*)    ALT 12 (*)    All other components within normal limits  URINALYSIS, ROUTINE W REFLEX MICROSCOPIC (NOT AT [facility]) - Abnormal; Notable for the following:    APPearance CLOUDY (*)    All other components within normal limits  POC OCCULT BLOOD, ED - Abnormal; Notable for the following:    Fecal Occult Bld POSITIVE (*)    All other components within normal limits  CBC WITH DIFFERENTIAL/PLATELET  LIPASE, BLOOD    Imaging Review No results found. I have personally reviewed and evaluated these images and lab results as part of my medical decision-making.   EKG Interpretation None      MDM   Final diagnoses:  Lower abdominal pain    BP 107/69 mmHg  Pulse 66  Temp(Src) 98.1 F (36.7 C)  Resp 15  Ht 5\' 11"  (1.803 m)  Wt 68.493 kg  BMI 21.07 kg/m2  SpO2 100%     Fayrene HelperBowie Audy Dauphine, PA-C 09/27/15 0913  Lorre NickAnthony Allen, MD 09/28/15 [phone]

## 2017-07-02 ENCOUNTER — Encounter (HOSPITAL_COMMUNITY): Payer: Self-pay | Admitting: Emergency Medicine

## 2017-07-02 ENCOUNTER — Emergency Department (HOSPITAL_COMMUNITY)
Admission: EM | Admit: 2017-07-02 | Discharge: 2017-07-02 | Disposition: A | Discharge status: Home / Self Care | Payer: 59 | Attending: Emergency Medicine | Admitting: Emergency Medicine

## 2017-07-02 DIAGNOSIS — R1033 Periumbilical pain: Secondary | ICD-10-CM | POA: Diagnosis not present

## 2017-07-02 DIAGNOSIS — Z87891 Personal history of nicotine dependence: Secondary | ICD-10-CM | POA: Insufficient documentation

## 2017-07-02 DIAGNOSIS — R197 Diarrhea, unspecified: Secondary | ICD-10-CM | POA: Diagnosis not present

## 2017-07-02 LAB — COMPREHENSIVE METABOLIC PANEL
ALBUMIN: 5 g/dL (ref 3.5–5.0)
ALK PHOS: 40 U/L (ref 38–126)
ALT: 17 U/L (ref 17–63)
ANION GAP: 9 (ref 5–15)
AST: 24 U/L (ref 15–41)
BUN: 11 mg/dL (ref 6–20)
CALCIUM: 10.4 mg/dL — AB (ref 8.9–10.3)
CO2: 27 mmol/L (ref 22–32)
Chloride: 103 mmol/L (ref 101–111)
Creatinine, Ser: 1.29 mg/dL — ABNORMAL HIGH (ref 0.61–1.24)
GFR calc Af Amer: >60 mL/min (ref >60)
GFR calc non Af Amer: >60 mL/min (ref >60)
GLUCOSE: 101 mg/dL — AB (ref 65–99)
Potassium: 4.5 mmol/L (ref 3.5–5.1)
SODIUM: 139 mmol/L (ref 135–145)
Total Bilirubin: 0.9 mg/dL (ref 0.3–1.2)
Total Protein: 8.1 g/dL (ref 6.5–8.1)

## 2017-07-02 LAB — URINALYSIS, ROUTINE W REFLEX MICROSCOPIC
BILIRUBIN URINE: NEGATIVE
GLUCOSE, UA: NEGATIVE mg/dL
HGB URINE DIPSTICK: NEGATIVE
KETONES UR: NEGATIVE mg/dL
Leukocytes, UA: NEGATIVE
Nitrite: NEGATIVE
PH: 6 (ref 5.0–8.0)
Protein, ur: NEGATIVE mg/dL
SPECIFIC GRAVITY, URINE: 1.024 (ref 1.005–1.030)

## 2017-07-02 LAB — CBC
HEMATOCRIT: 47.8 % (ref 39.0–52.0)
HEMOGLOBIN: 17 g/dL (ref 13.0–17.0)
MCH: 31.1 pg (ref 26.0–34.0)
MCHC: 35.6 g/dL (ref 30.0–36.0)
MCV: 87.5 fL (ref 78.0–100.0)
Platelets: 303 10*3/uL (ref 150–400)
RBC: 5.46 MIL/uL (ref 4.22–5.81)
RDW: 13.3 % (ref 11.5–15.5)
WBC: 5.4 10*3/uL (ref 4.0–10.5)

## 2017-07-02 LAB — LIPASE, BLOOD: Lipase: 26 U/L (ref 11–51)

## 2017-07-02 MED ORDER — HYOSCYAMINE SULFATE 0.125 MG PO TBDP
0.1250 mg | ORAL_TABLET | Freq: Once | ORAL | Status: AC
  Administered 2017-07-02: 0.125 mg via ORAL
  Filled 2017-07-02: qty 1

## 2017-07-02 MED ORDER — DICYCLOMINE HCL 20 MG PO TABS
20.0000 mg | ORAL_TABLET | Freq: Two times a day (BID) | ORAL | 0 refills | Status: AC | PRN
Start: 2017-07-02 — End: ?

## 2017-07-02 NOTE — ED Provider Notes (Signed)
[facility]-EMERGENCY DEPT Provider Note   CSN: [id number] Arrival date & time: 07/02/17  8022     History   Chief Complaint Chief Complaint  Patient presents with  . Abdominal Pain    HPI Louis Freeman is a 31 y.o. male.  The history is provided by the patient. No language interpreter was used.  Abdominal Pain      Louis Freeman is a 31 y.o. male who presents to the Emergency Department complaining of abdominal pain.  Reports 1 week of Central, cramping abdominal pain. She reports associated diarrhea with 7-10 loose bowel movements daily. He did have initial bloody stools but none currently. No fevers, vomiting, dysuria. He does have increased cramping with meals. He did eat out a week ago and 2 other people that ate at the restaurant did have some cramping and diarrhea but there is was brief and has resolved. He denies any medical problems had no prior abdominal surgeries. He drinks alcohol occasionally.  Symptoms are moderate in nature.  History reviewed. No pertinent past medical history.  There are no active problems to display for this patient.   History reviewed. No pertinent surgical history.     Home Medications    Prior to Admission medications   Medication Sig Start Date End Date Taking? Authorizing Provider  Bismuth Subsalicylate (KAOPECTATE PO) Take 2 tablets by mouth 2 (two) times daily as needed (upset stomach).   Yes [provider]  dicyclomine (BENTYL) 20 MG tablet Take 1 tablet (20 mg total) by mouth 2 (two) times daily as needed for spasms. 07/02/17   Tilden Fossa, MD    Family History History reviewed. No pertinent family history.  Social History Social History  Substance Use Topics  . Smoking status: Former Smoker    Quit date: 05/27/2012  . Smokeless tobacco: Never Used  . Alcohol use 1.2 oz/week    2 Standard drinks or equivalent per week     Allergies   Patient has no known allergies.   Review of Systems Review of Systems    Gastrointestinal: Positive for abdominal pain.  All other systems reviewed and are negative.    Physical Exam Updated Vital Signs BP 101/87 (BP Location: Left Arm)   Pulse (!) 57   Temp 98.4 F (36.9 C) (Oral)   Resp 16   Ht 5\' 11"  (1.803 m)   Wt 68 kg (150 lb)   SpO2 100%   BMI 20.92 kg/m   Physical Exam  Constitutional: He is oriented to person, place, and time. He appears well-developed and well-nourished.  HENT:  Head: Normocephalic and atraumatic.  Cardiovascular: Normal rate and regular rhythm.   No murmur heard. Pulmonary/Chest: Effort normal and breath sounds normal. No respiratory distress.  Abdominal: Soft. There is no rebound and no guarding.  Mild to moderate periumbilical tenderness without guarding or rebound. No hernias.  Musculoskeletal: He exhibits no edema or tenderness.  Neurological: He is alert and oriented to person, place, and time.  Skin: Skin is warm and dry.  Psychiatric: He has a normal mood and affect. His behavior is normal.  Nursing note and vitals reviewed.    ED Treatments / Results  Labs (all labs ordered are listed, but only abnormal results are displayed) Labs Reviewed  COMPREHENSIVE METABOLIC PANEL - Abnormal; Notable for the following:       Result Value   Glucose, Bld 101 (*)    Creatinine, Ser 1.29 (*)    Calcium 10.4 (*)    All other components  within normal limits  LIPASE, BLOOD  CBC  URINALYSIS, ROUTINE W REFLEX MICROSCOPIC    EKG  EKG Interpretation None       Radiology No results found.  Procedures Procedures (including critical care time)  Medications Ordered in ED Medications  hyoscyamine (ANASPAZ) disintergrating tablet 0.125 mg (0.125 mg Oral Given 07/02/17 1153)     Initial Impression / Assessment and Plan / ED Course  I have reviewed the triage vital signs and the nursing notes.  Pertinent labs & imaging results that were available during my care of the patient were reviewed by me and considered  in my medical decision making (see chart for details).     Patient here for evaluation of 1 week of central abdominal pain that is crampy in nature and waxing and waning episodes with associated diarrhea. He has mild to moderate central abdominal tenderness on examination without any peritoneal findings. Current clinical picture is not consistent with acute appendicitis, cholecystitis. Question viral process versus IBD.  Discussed with patient the risks and benefits of CT scan and he declines at this time. Counseled patient with home care, outpatient follow-up as well as return precautions for any worsening pain for repeat assessment to rule out appendicitis.  Final Clinical Impressions(s) / ED Diagnoses   Final diagnoses:  Periumbilical abdominal pain  Diarrhea, unspecified type    New Prescriptions Discharge Medication List as of 07/02/2017  1:30 PM    START taking these medications   Details  dicyclomine (BENTYL) 20 MG tablet Take 1 tablet (20 mg total) by mouth 2 (two) times daily as needed for spasms., Starting Wed 07/02/2017, Print         Tilden Fossa, MD 07/02/17 [phone]

## 2017-07-02 NOTE — ED Notes (Signed)
Bed: [id number] Expected date:  Expected time:  Means of arrival:  Comments: [place]

## 2017-07-02 NOTE — ED Notes (Signed)
Pt ambulatory and independent at discharge.  Verbalized understanding of discharge instructions 

## 2017-07-02 NOTE — ED Triage Notes (Signed)
Pt states he has had abdominal pain/cramps with diarrhea since last Wednesday. Periumbilical. Alert and oriented.

## 2017-07-02 NOTE — ED Notes (Signed)
Patient attempted to void with no success.

## 2018-11-06 ENCOUNTER — Emergency Department (HOSPITAL_COMMUNITY)
Admission: EM | Admit: 2018-11-06 | Discharge: 2018-11-06 | Disposition: A | Discharge status: Home / Self Care | Payer: 59 | Attending: Emergency Medicine | Admitting: Emergency Medicine

## 2018-11-06 ENCOUNTER — Other Ambulatory Visit: Payer: Self-pay

## 2018-11-06 ENCOUNTER — Encounter (HOSPITAL_COMMUNITY): Payer: Self-pay

## 2018-11-06 ENCOUNTER — Emergency Department (HOSPITAL_COMMUNITY): Payer: 59

## 2018-11-06 DIAGNOSIS — Y999 Unspecified external cause status: Secondary | ICD-10-CM | POA: Insufficient documentation

## 2018-11-06 DIAGNOSIS — Y9367 Activity, basketball: Secondary | ICD-10-CM | POA: Diagnosis not present

## 2018-11-06 DIAGNOSIS — Z87891 Personal history of nicotine dependence: Secondary | ICD-10-CM | POA: Diagnosis not present

## 2018-11-06 DIAGNOSIS — Y929 Unspecified place or not applicable: Secondary | ICD-10-CM | POA: Diagnosis not present

## 2018-11-06 DIAGNOSIS — W269XXA Contact with unspecified sharp object(s), initial encounter: Secondary | ICD-10-CM | POA: Diagnosis not present

## 2018-11-06 DIAGNOSIS — Z23 Encounter for immunization: Secondary | ICD-10-CM | POA: Diagnosis not present

## 2018-11-06 DIAGNOSIS — S61217A Laceration without foreign body of left little finger without damage to nail, initial encounter: Secondary | ICD-10-CM | POA: Insufficient documentation

## 2018-11-06 MED ORDER — CEPHALEXIN 500 MG PO CAPS: 500.0000 mg | ORAL_CAPSULE | Freq: Three times a day (TID) | ORAL | 0 refills | Status: AC

## 2018-11-06 MED ORDER — LIDOCAINE HCL 2 % IJ SOLN
5.0000 mL | Freq: Once | INTRAMUSCULAR | Status: AC
  Administered 2018-11-06: 100 mg
  Filled 2018-11-06: qty 20

## 2018-11-06 MED ORDER — TETANUS-DIPHTH-ACELL PERTUSSIS 5-2.5-18.5 LF-MCG/0.5 IM SUSP
0.5000 mL | Freq: Once | INTRAMUSCULAR | Status: AC
  Administered 2018-11-06: 0.5 mL via INTRAMUSCULAR
  Filled 2018-11-06: qty 0.5

## 2018-11-06 NOTE — Discharge Instructions (Signed)
Take Keflex as prescribed.  Clean wound with water and soap.  Return to the ED in 10 to 14 days to have sutures removed or sooner as signs of any infection.  Return to the ED immediately for new or worsening symptoms, increased redness, swelling, pus, fevers, decreased range of motion or any concerns at all.

## 2018-11-06 NOTE — ED Triage Notes (Signed)
Patient is [name] and ambulatory. Patient arrived via POV. Patient chief complaint is Finger Laceration to the left hand. Patient was playing basketball last night, around 2200, and patient fell on wood floor in sports [place]. Patient has not other complaints.

## 2018-11-06 NOTE — ED Provider Notes (Signed)
[facility]-EMERGENCY DEPT Provider Note   CSN: [id number] Arrival date & time: 11/06/18  [id number]     History   Chief Complaint Chief Complaint  Patient presents with  . Finger Laceration    HPI Louis Freeman is a 33 y.o. male.  HPI  33 year old male presents 1 day status post injury to his left pinky finger.  Patient states yesterday he was playing basketball at approximately 10 PM when someone undercut his finger.  He states he did not realize how deep the cut was until today when he cleaned it off.  He denies any other injury or trauma.  He denies any decreased range of motion, numbness, tingling.  History reviewed. No pertinent past medical history.  There are no active problems to display for this patient.   History reviewed. No pertinent surgical history.      Home Medications    Prior to Admission medications   Medication Sig Start Date End Date Taking? Authorizing Provider  Bismuth Subsalicylate (KAOPECTATE PO) Take 2 tablets by mouth 2 (two) times daily as needed (upset stomach).    [provider]  dicyclomine (BENTYL) 20 MG tablet Take 1 tablet (20 mg total) by mouth 2 (two) times daily as needed for spasms. 07/02/17   Tilden Fossaees, Elizabeth, MD    Family History No family history on file.  Social History Social History   Tobacco Use  . Smoking status: Former Smoker    Last attempt to quit: 05/27/2012    Years since quitting: 6.4  . Smokeless tobacco: Never Used  Substance Use Topics  . Alcohol use: Not Currently  . Drug use: No     Allergies   Patient has no known allergies.   Review of Systems Review of Systems  Constitutional: Negative for chills and fever.  Respiratory: Negative for shortness of breath.   Cardiovascular: Negative for chest pain.  Gastrointestinal: Negative for abdominal pain, nausea and vomiting.  Skin: Positive for wound (left pinky finger).     Physical Exam Updated Vital Signs BP (!) 117/59  (BP Location: Left Arm)   Pulse 70   Temp (!) 97.3 F (36.3 C) (Oral)   Resp 16   Ht 5\' 11"  (1.803 m)   Wt 68.9 kg   SpO2 100%   BMI 21.20 kg/m   Physical Exam Vitals signs and nursing note reviewed.  Constitutional:      Appearance: He is well-developed.  HENT:     Head: Normocephalic and atraumatic.  Eyes:     Conjunctiva/sclera: Conjunctivae normal.  Neck:     Musculoskeletal: Neck supple.  Cardiovascular:     Rate and Rhythm: Normal rate and regular rhythm.     Heart sounds: Normal heart sounds. No murmur.  Pulmonary:     Effort: Pulmonary effort is normal. No respiratory distress.     Breath sounds: Normal breath sounds. No wheezing or rales.  Abdominal:     General: Bowel sounds are normal. There is no distension.     Palpations: Abdomen is soft.     Tenderness: There is no abdominal tenderness.  Musculoskeletal: Normal range of motion.        General: No tenderness or deformity.     Left hand: He exhibits laceration (volar pinky finger at level of MCP, approx 3 cm, no visible tendons). He exhibits normal range of motion, no tenderness, no bony tenderness and normal capillary refill. Normal sensation noted. Normal strength noted.  Skin:    General: Skin is warm  and dry.     Findings: No erythema or rash.  Neurological:     Mental Status: He is alert and oriented to person, place, and time.  Psychiatric:        Behavior: Behavior normal.      ED Treatments / Results  Labs (all labs ordered are listed, but only abnormal results are displayed) Labs Reviewed - No data to display  EKG None  Radiology Dg Hand Complete Left  Result Date: 11/06/2018 CLINICAL DATA:  Laceration at the fifth MCP joint yesterday due to a fall. Initial encounter. EXAM: LEFT HAND - COMPLETE 3+ VIEW COMPARISON:  None. FINDINGS: There is no evidence of fracture or dislocation. There is no evidence of arthropathy or other focal bone abnormality. Soft tissues are unremarkable. No radiopaque  foreign body. IMPRESSION: Negative exam. Electronically Signed   By: Drusilla Kanner M.D.   On: 11/06/2018 18:43    Procedures .Marland KitchenLaceration Repair Date/Time: 11/06/2018 8:53 PM Performed by: Clayborne Artist, PA-C Authorized by: Clayborne Artist, PA-C   Consent:    Consent obtained:  Verbal   Consent given by:  Patient   Risks discussed:  Infection, need for additional repair, pain, poor cosmetic result and poor wound healing   Alternatives discussed:  No treatment and delayed treatment Universal protocol:    Procedure explained and questions answered to patient or proxy's satisfaction: yes     Relevant documents present and verified: yes     Test results available and properly labeled: yes     Imaging studies available: yes     Required blood products, implants, devices, and special equipment available: yes     Site/side marked: yes     Immediately prior to procedure, a time out was called: yes     Patient identity confirmed:  Verbally with patient Anesthesia (see MAR for exact dosages):    Anesthesia method:  Local infiltration   Local anesthetic:  Lidocaine 2% w/o epi Laceration details:    Location:  Finger   Finger location:  L small finger   Length (cm):  3   Depth (mm):  5 Repair type:    Repair type:  Simple Pre-procedure details:    Preparation:  Imaging obtained to evaluate for foreign bodies Exploration:    Hemostasis achieved with:  Direct pressure   Wound exploration: wound explored through full range of motion and entire depth of wound probed and visualized     Wound extent: no foreign bodies/material noted, no muscle damage noted, no tendon damage noted, no underlying fracture noted and no vascular damage noted     Contaminated: yes   Treatment:    Area cleansed with:  Betadine   Amount of cleaning:  Extensive   Irrigation solution:  Sterile water   Irrigation volume:  500   Irrigation method:  Pressure wash   Visualized foreign bodies/material  removed: no   Skin repair:    Repair method:  Sutures   Suture size:  3-0   Suture technique:  Simple interrupted   Number of sutures:  4 Approximation:    Approximation:  Loose Post-procedure details:    Dressing:  Sterile dressing   Patient tolerance of procedure:  Tolerated well, no immediate complications   (including critical care time)  Medications Ordered in ED Medications  lidocaine (XYLOCAINE) 2 % (with pres) injection 100 mg (100 mg Infiltration Given 11/06/18 1929)     Initial Impression / Assessment and Plan / ED Course  I have reviewed the  triage vital signs and the nursing notes.  Pertinent labs & imaging results that were available during my care of the patient were reviewed by me and considered in my medical decision making (see chart for details).     Patient presents with laceration to his left pinky.  Laceration approximately 3 cm along the volar aspect of the left pinky at the MCP.  No visible tendons, pink he has full range of motion, full extension and flexion.  Injury happened at approximately 10 PM yesterday.  Discussed risk and benefits and alternatives to suture closure at this time.  Given location and depth of the wound, recommended suture closure.  However patient understands risks of infection.  Wound irrigated vigorously and Betadine used.  Will start patient on prophylactic antibiotics.  Patient expresses understanding.  He is agreeable with suture closure at this time.  Patient ready and stable for discharge.  Final Clinical Impressions(s) / ED Diagnoses   Final diagnoses:  None    ED Discharge Orders    None       Rueben Bash 11/06/18 2150    Arby Barrette, MD 11/08/18 2218

## 2018-11-20 ENCOUNTER — Emergency Department (HOSPITAL_COMMUNITY)
Admission: EM | Admit: 2018-11-20 | Discharge: 2018-11-20 | Disposition: A | Discharge status: Home / Self Care | Payer: No Typology Code available for payment source

## 2019-05-19 ENCOUNTER — Emergency Department (HOSPITAL_COMMUNITY)
Admission: EM | Admit: 2019-05-19 | Discharge: 2019-05-19 | Disposition: A | Discharge status: Home / Self Care | Payer: 59 | Attending: Emergency Medicine | Admitting: Emergency Medicine

## 2019-05-19 DIAGNOSIS — Z87891 Personal history of nicotine dependence: Secondary | ICD-10-CM | POA: Diagnosis not present

## 2019-05-19 DIAGNOSIS — M79671 Pain in right foot: Secondary | ICD-10-CM | POA: Diagnosis present

## 2019-05-19 NOTE — ED Provider Notes (Signed)
[facility]-EMERGENCY DEPT Provider Note   CSN: [id number] Arrival date & time: 05/19/19  [id number]    History   Chief Complaint Chief Complaint  Patient presents with  . Foot Pain    HPI Louis Freeman is a 33 y.o. male who presents for evaluation of right foot pain that is been ongoing for last week.  He reports some associated swelling but has not noticed any overlying warmth, erythema.  He does report that he play sports and states that he has had the same issue with his foot for about 10 years.  He states that this most recent episode has caused him to stay up at night because of pain.  He reports he has not taken anything for pain. He denies any fevers, numbness/weakness.  He denies any preceding trauma, injury, fall.       The history is provided by the patient.    No past medical history on file.  There are no active problems to display for this patient.   No past surgical history on file.      Home Medications    Prior to Admission medications   Medication Sig Start Date End Date Taking? Authorizing Provider  Bismuth Subsalicylate (KAOPECTATE PO) Take 2 tablets by mouth 2 (two) times daily as needed (upset stomach).    [provider]  cephALEXin (KEFLEX) 500 MG capsule Take 1 capsule (500 mg total) by mouth 3 (three) times daily. 11/06/18   Kendrick, Caitlyn S, PA-C  dicyclomine (BENTYL) 20 MG tablet Take 1 tablet (20 mg total) by mouth 2 (two) times daily as needed for spasms. 07/02/17   Tilden Fossaees, Elizabeth, MD    Family History No family history on file.  Social History Social History   Tobacco Use  . Smoking status: Former Smoker    Quit date: 05/27/2012    Years since quitting: 6.9  . Smokeless tobacco: Never Used  Substance Use Topics  . Alcohol use: Not Currently  . Drug use: No     Allergies   Patient has no known allergies.   Review of Systems Review of Systems  Constitutional: Negative for fever.  Musculoskeletal:       Foot pain  Skin: Negative for color change.  Neurological: Negative for weakness and numbness.  All other systems reviewed and are negative.    Physical Exam Updated Vital Signs BP 106/73 (BP Location: Left Arm)   Pulse 94   Temp 98.1 F (36.7 C) (Oral)   Resp 16   Ht 5\' 11"  (1.803 m)   Wt 68 kg   SpO2 100%   BMI 20.92 kg/m   Physical Exam Vitals signs and nursing note reviewed.  Constitutional:      Appearance: He is well-developed.  HENT:     Head: Normocephalic and atraumatic.  Eyes:     General: No scleral icterus.       Right eye: No discharge.        Left eye: No discharge.     Conjunctiva/sclera: Conjunctivae normal.  Cardiovascular:     Pulses:          Dorsalis pedis pulses are 2+ on the right side and 2+ on the left side.  Pulmonary:     Effort: Pulmonary effort is normal.  Musculoskeletal:     Comments: Mild tenderness palpation noted to the dorsal aspect of the right foot.  There is a small area of overlying soft tissue swelling.  No edema.  No overlying warmth, erythema.  No deformity or crepitus noted.  He is able to wiggle all 5 toes without any difficulty.  Dorsiflexion and plantarflexion of right foot intact without any difficulty.  No tenderness palpation noted to right ankle, right tib-fib.  No tenderness palpation noted to left lower extremity.  Skin:    General: Skin is warm and dry.     Comments: Good distal cap refill. RLE is not dusky in appearance or cool to touch.  Neurological:     Mental Status: He is alert.     Comments: Sensation intact along major nerve distributions of BLE  Psychiatric:        Speech: Speech normal.        Behavior: Behavior normal.      ED Treatments / Results  Labs (all labs ordered are listed, but only abnormal results are displayed) Labs Reviewed - No data to display  EKG None  Radiology No results found.  Procedures Procedures (including critical care time)  Medications Ordered in ED  Medications - No data to display   Initial Impression / Assessment and Plan / ED Course  I have reviewed the triage vital signs and the nursing notes.  Pertinent labs & imaging results that were available during my care of the patient were reviewed by me and considered in my medical decision making (see chart for details).        33 year old male who presents for evaluation of right foot pain x1 week.  Reports some associated swelling.  He states that he has had this issue intermittently over the last 10 years related to a sports injury.  He reports that every once in a while, it flares up.  No recent trauma, injury, fall.  No fevers, numbness/weakness, warmth, erythema. Patient is afebrile, non-toxic appearing, sitting comfortably on examination table. Vital signs reviewed and stable. Patient is neurovascularly intact.  Consider inflammation versus musculoskeletal pain.  History/physical exam concern for gout, septic arthritis, acute arterial embolism, ischemic leg, DVT.  I did offer patient x-ray imaging for evaluation but he declined.  Encouraged at home supportive care measures. At this time, patient exhibits no emergent life-threatening condition that require further evaluation in ED or admission. Patient had ample opportunity for questions and discussion. All patient's questions were answered with full understanding. Strict return precautions discussed. Patient expresses understanding and agreement to plan.   Portions of this note were generated with [employer]. Dictation errors may occur despite best attempts at proofreading.   Final Clinical Impressions(s) / ED Diagnoses   Final diagnoses:  Foot pain, right    ED Discharge Orders    None       Desma Mcgregor 05/19/19 6834    Lennice Sites, DO 05/19/19 1110

## 2019-05-19 NOTE — Discharge Instructions (Signed)
You can take Tylenol or Ibuprofen as directed for pain. You can alternate Tylenol and Ibuprofen every 4 hours. If you take Tylenol at 1pm, then you can take Ibuprofen at 5pm. Then you can take Tylenol again at 9pm.   Follow the RICE (Rest, Ice, Compression, Elevation) protocol as directed.   Follow-up with the [facility].  Return the emergency department for any worsening pain, redness or swelling of the foot, fevers or any other worsening or concerning symptoms.

## 2019-05-19 NOTE — ED Triage Notes (Signed)
Patient is complaining of right foot pain that started a few days ago. Pain described as sharp. No recent injuries.

## 2020-09-06 IMAGING — DX DG HAND COMPLETE 3+V*L*
3 series · 3 of 3 positions shown · non-contrast
Comparison: None.

CLINICAL DATA: Laceration at the fifth MCP joint yesterday due to a
fall. Initial encounter.

EXAM:
LEFT HAND - COMPLETE 3+ VIEW

[hand ap]
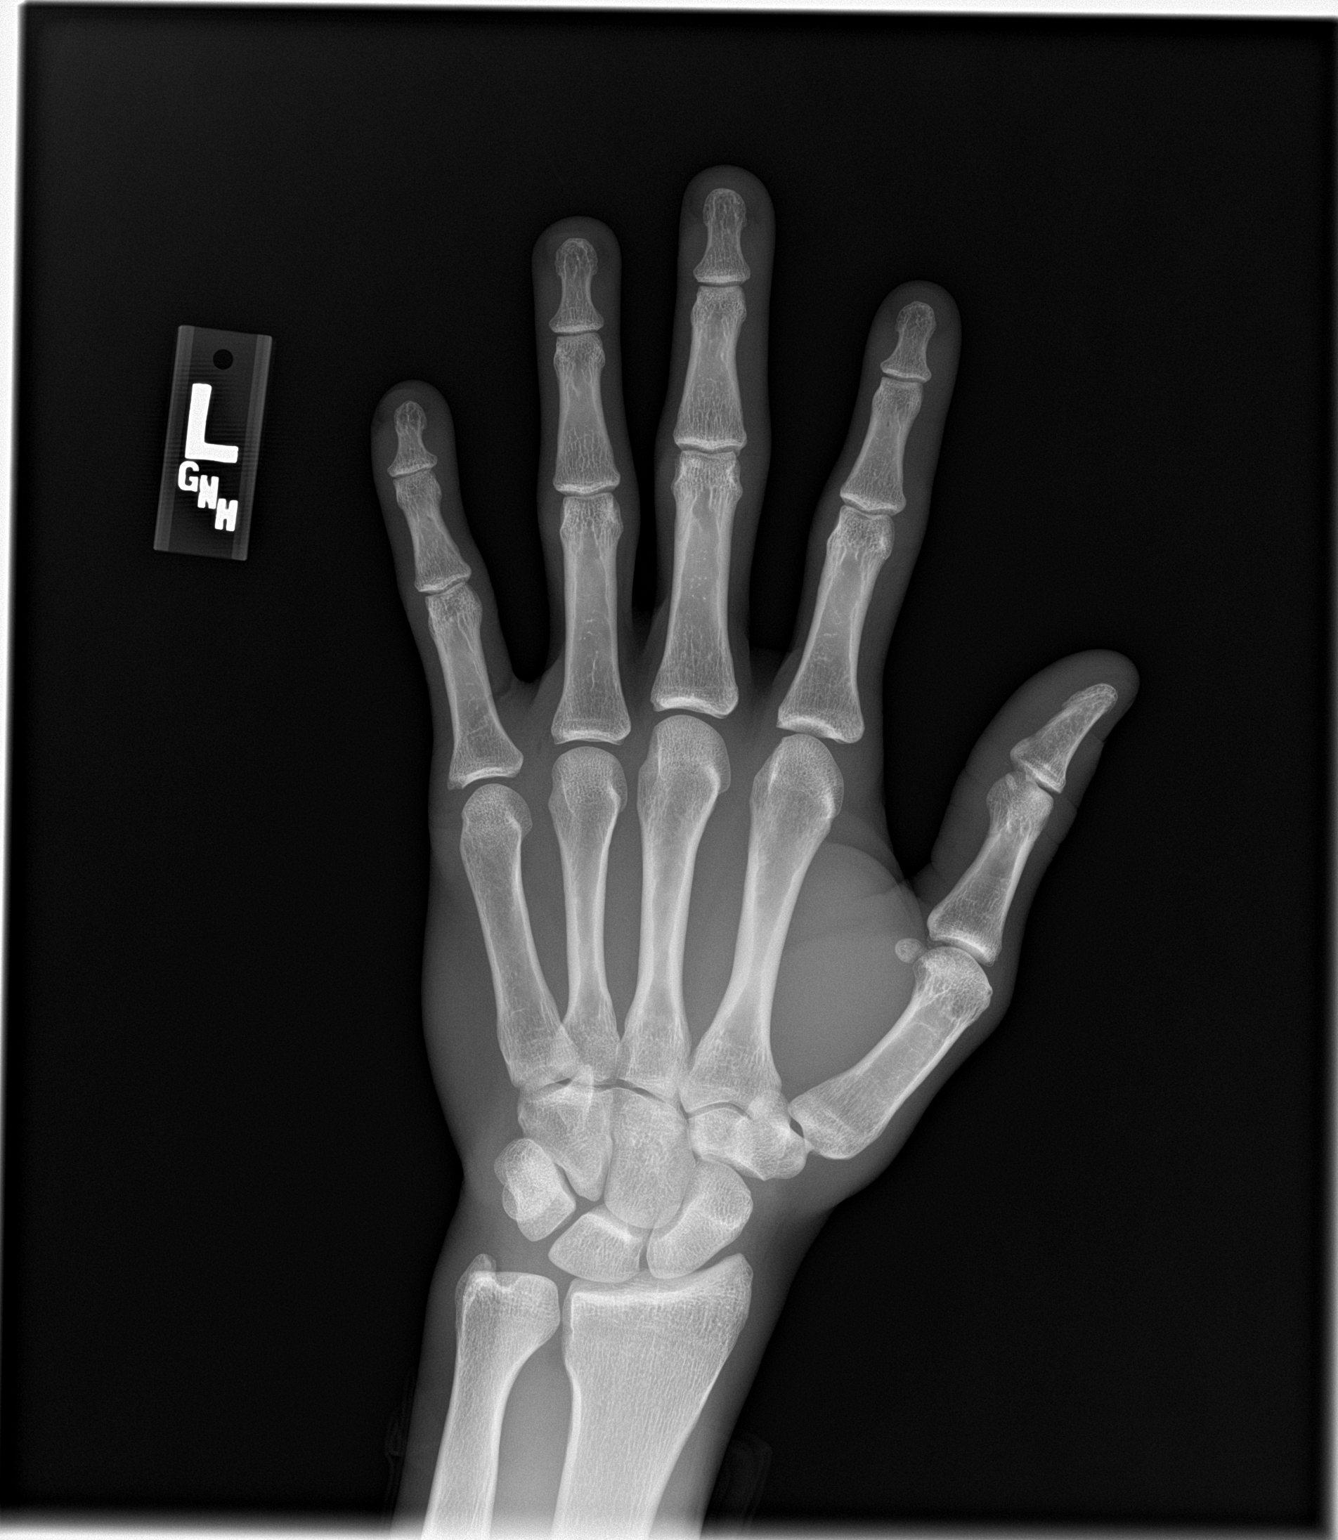

[hand obl]
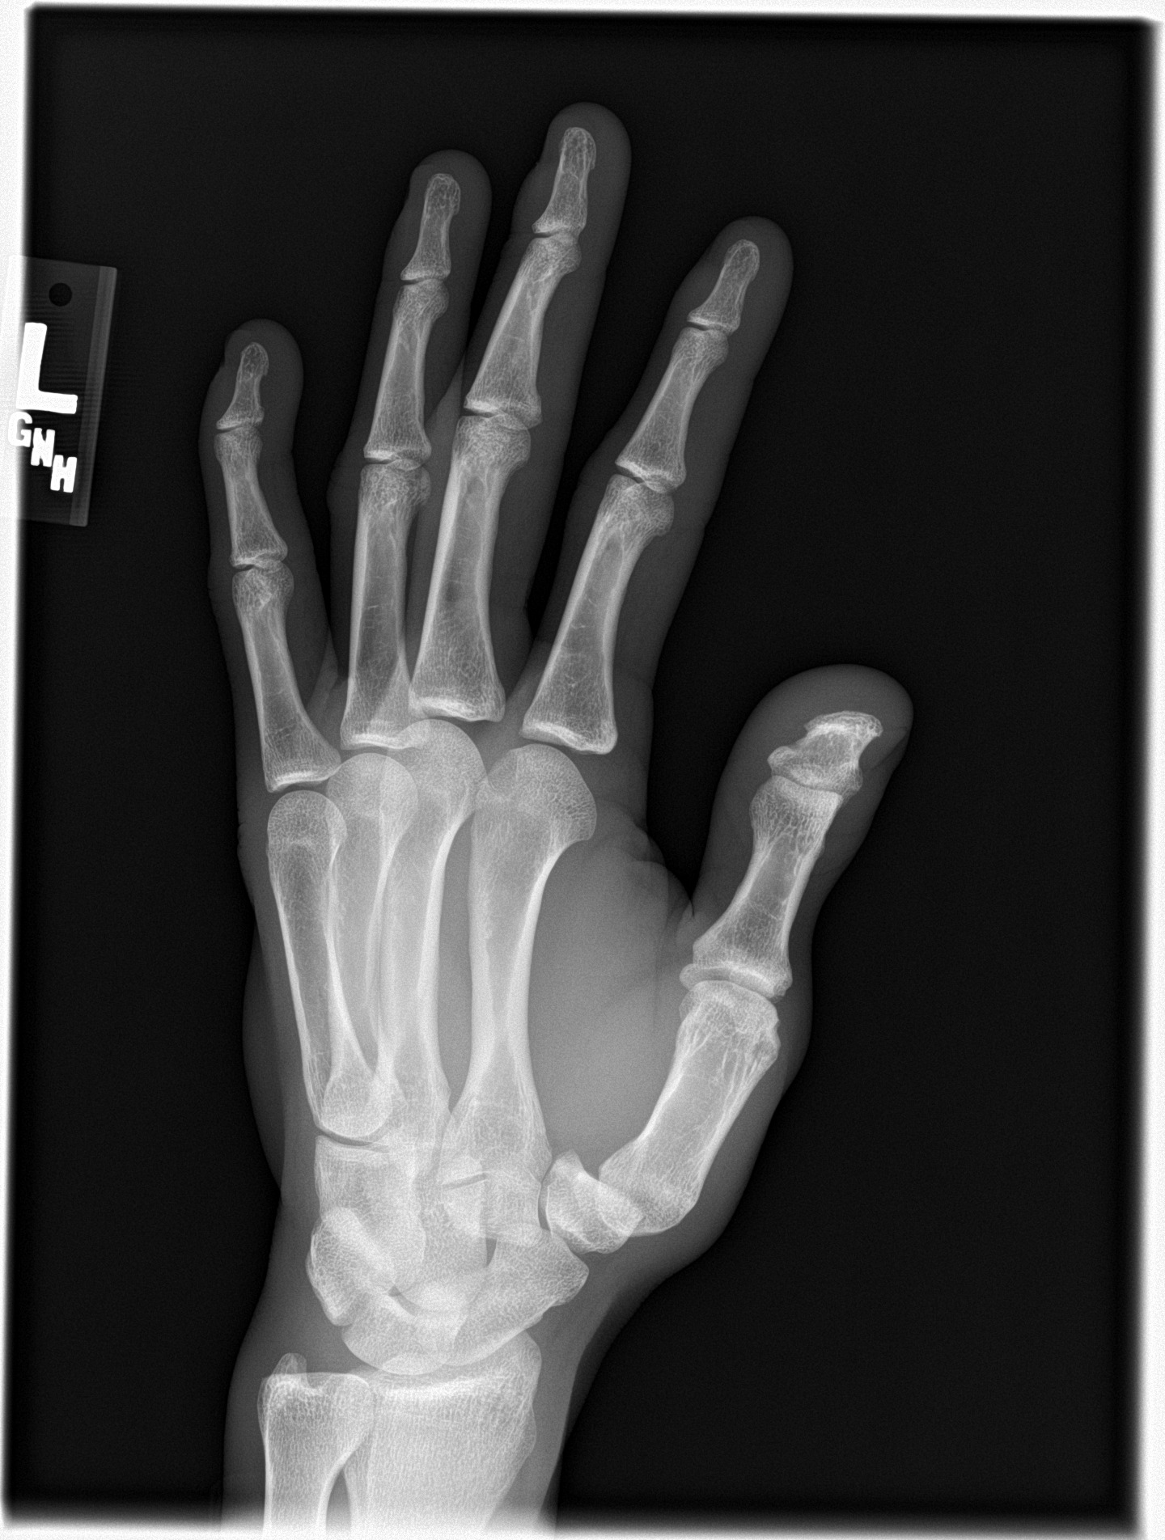

[hand lat]
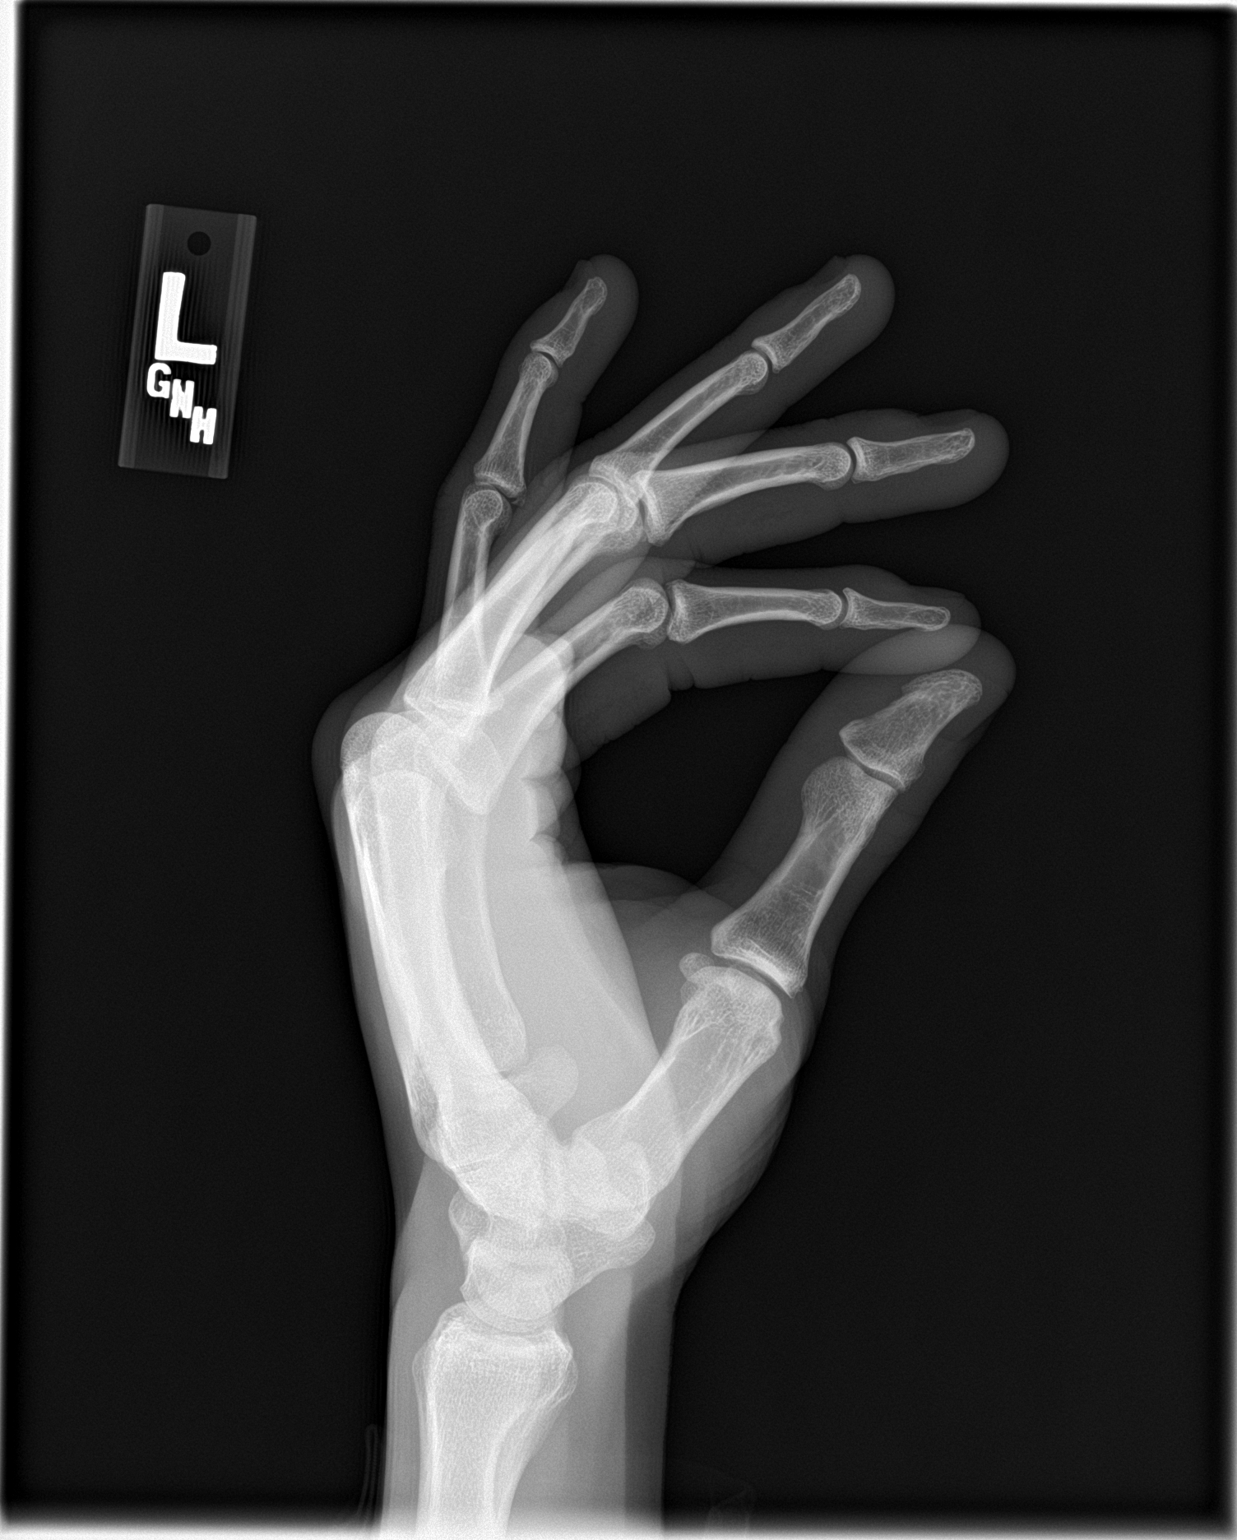

[3 of 3 positions shown; findings below may reference images not displayed]

FINDINGS: There is no evidence of fracture or dislocation. There is no
evidence of arthropathy or other focal bone abnormality. Soft
tissues are unremarkable. No radiopaque foreign body.
IMPRESSION: Negative exam.

## 2022-01-24 ENCOUNTER — Ambulatory Visit
Admission: EM | Admit: 2022-01-24 | Discharge: 2022-01-24 | Disposition: A | Discharge status: Home / Self Care | Payer: No Typology Code available for payment source
